# Patient Record
Sex: Male | Born: 1990 | Race: White | Hispanic: No | Marital: Single | Smoking: Never smoker
Health system: Southern US, Community
[De-identification: ages and names within clinical notes are randomized; demographics above are authoritative.]

## PROBLEM LIST (undated history)

## (undated) DIAGNOSIS — S42309A Unspecified fracture of shaft of humerus, unspecified arm, initial encounter for closed fracture: Secondary | ICD-10-CM

## (undated) DIAGNOSIS — L709 Acne, unspecified: Secondary | ICD-10-CM

## (undated) DIAGNOSIS — B019 Varicella without complication: Secondary | ICD-10-CM

## (undated) DIAGNOSIS — F840 Autistic disorder: Secondary | ICD-10-CM

## (undated) DIAGNOSIS — J309 Allergic rhinitis, unspecified: Secondary | ICD-10-CM

## (undated) HISTORY — DX: Unspecified fracture of shaft of humerus, unspecified arm, initial encounter for closed fracture: S42.309A

## (undated) HISTORY — DX: Autistic disorder: F84.0

## (undated) HISTORY — DX: Allergic rhinitis, unspecified: J30.9

## (undated) HISTORY — PX: ADENOIDECTOMY: SUR15

## (undated) HISTORY — DX: Varicella without complication: B01.9

## (undated) HISTORY — PX: FRACTURE SURGERY: SHX138

## (undated) HISTORY — PX: OTHER SURGICAL HISTORY: SHX169

## (undated) HISTORY — DX: Acne, unspecified: L70.9

---
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
## 2009-04-01 ENCOUNTER — Encounter: Admission: RE | Admit: 2009-04-01 | Discharge: 2009-04-01 | Payer: Self-pay | Admitting: Orthopedic Surgery

## 2009-04-16 ENCOUNTER — Ambulatory Visit: Payer: Self-pay | Admitting: Internal Medicine

## 2009-10-24 ENCOUNTER — Ambulatory Visit: Payer: Self-pay | Admitting: Internal Medicine

## 2010-03-25 NOTE — Assessment & Plan Note (Signed)
Summary: cpx/ssc   Vital Signs:  Patient profile:   20 year old male Height:      69.25 inches Weight:      186 pounds Pulse rate:   97 / minute BP sitting:   100 / 60  (right arm) Cuff size:   regular  Vitals Entered By: Romualdo Bolk, CMA (AAMA) (October 24, 2009 8:51 AM) CC: CPX- Transition on medication that Dr. Layla Barter in [place].   History of Present Illness: Brian Mcgrath comes in today  with mom for a check up preventive visit.  Since last visit  here  there have been no major changes in health status  .  Mom asking if possibel we may take over his meds used for autism . No change in meds for a while  Acne using otc wth some help  . No injury change in health status . Allergies :  ok.    Preventive Screening-Counseling & Management  Alcohol-Tobacco     Alcohol drinks/day: 0     Smoking Status: 0     Passive Smoke Exposure: no  Caffeine-Diet-Exercise     Caffeine use/day: 2     Does Patient Exercise: yes  Hep-HIV-STD-Contraception     Sun Exposure-Excessive: no  Safety-Violence-Falls     Seat Belt Use: yes     Smoke Detectors: yes  Current Medications (verified): 1)  Allegra 180 Mg  Tabs (Fexofenadine Hcl) .Marland Kitchen.. 1 By Mouth Once Daily 2)  Valtrex 1 Gm Tabs (Valacyclovir Hcl) .Marland Kitchen.. 1 By Mouth Three Times A Day 3)  Adderall 5 Mg Tabs (Amphetamine-Dextroamphetamine) .Marland Kitchen.. 1 By Mouth Qam and 1/2 in Afternoon. 4)  Tenex 1 Mg Tabs (Guanfacine Hcl) .... 3/4 Tab Two Times A Day 5)  Fluconazole 200 Mg Tabs (Fluconazole) .Marland Kitchen.. 1 By Mouth At Bedtime 6)  Zoloft 50 Mg Tabs (Sertraline Hcl) .... 31/2  By Mouth Once Daily 7)  Tretinoin 0.025 % Crea (Tretinoin) .... Apply Pea Sized Amount To Affected Area At Night  Allergies (verified): No Known Drug Allergies  Past History:  Past medical, surgical, family and social histories (including risk factors) reviewed, and no changes noted (except as noted below).  Past Medical History: Reviewed history from 04/16/2009  and no changes required. autism dx 1-19 years of age TEAAch allergic rhinitis normal preg and delivery with slight meconium Varicella  Acne  Past Surgical History: Reviewed history from 07/18/2008 and no changes required. Rock removed from ear 1999  Wol[place] Recircumised age 17 years Adenoidectomy Tonsillectomy  2003  Past History:  Care Management: Peds: [facility] in [place].  Family History: Reviewed history from 03/24/2007 and no changes required. allergies   dm in mggf father  depression , under eval for elevated PSA OSA  MOM : diverticulosis    Social History: Reviewed history from 07/18/2008 and no changes required. lives at home  Negative history of passive tobacco smoke exposure.  Video games  movies  tai kwon do   No alcohol. Seat Belt Use:  yes Sun Exposure-Excessive:  no  Review of Systems       ros 12 system  neg except for acne and  autism   function well with adls   Physical Exam  General:  Well-developed,well-nourished,in no acute distress; alert,appropriate and cooperative throughout examination Head:  normocephalic, atraumatic, and no abnormalities observed.   Eyes:  vision grossly intact, pupils equal, and pupils round.   Ears:  R ear normal, L ear normal, and no external deformities.   Nose:  no external deformity and no nasal discharge.   Mouth:  pharynx pink and moist.   Neck:  No deformities, masses, or tenderness noted. Chest Wall:  No deformities, masses, tenderness or gynecomastia noted. Lungs:  Normal respiratory effort, chest expands symmetrically. Lungs are clear to auscultation, no crackles or wheezes.no dullness.   Heart:  Normal rate and regular rhythm. S1 and S2 normal without gallop, murmur, click, rub or other extra sounds.no lifts.   Abdomen:  Bowel sounds positive,abdomen soft and non-tender without masses, organomegaly or hernias noted. Genitalia:  Testes bilaterally descended without nodularity, tenderness or masses. No scrotal  masses or lesions. No penis lesions or urethral discharge. Msk:  normal ROM, no joint tenderness, no joint warmth, and no redness over joints.   Pulses:  pulses intact without delay   Extremities:  no clubbing cyanosis or edema  Neurologic:  alert & oriented X3, strength normal in all extremities, and gait normal.  autistic speech cooperative  and interactive  Skin:  midlt o mod acne on face  few inflammatory jaw chin  lesion no scarring  Cervical Nodes:  No lymphadenopathy noted Axillary Nodes:  No palpable lymphadenopathy Inguinal Nodes:  No significant adenopathy Psych:  Oriented X3, not anxious appearing, and not depressed appearing.  autistic but cooperative and monotone speech    Impression & Recommendations:  Problem # 1:  PREVENTIVE HEALTH CARE (ICD-V70.0) counseled healthy    lifestyle eating exercise etc   Problem # 2:  AUTISM (ICD-299.00) no change under care from distant   physician since young   no obv se of meds  mom discussing whether we could rx meds as appropriate since he is stable and  no chang ein meds for quite a while . reviewed med list with her and reasonable to continue zoloft and stimulant meds and tenex .  prob would stop the diflucan and  decrease high dose valtrex that he is onfro high toters of  hsv antibodies . ( no hx of hsv known)     Problem # 3:  ACNE VULGARIS (ICD-706.1) reviewed continued rx    and can still use otcs.   His updated medication list for this problem includes:    Tretinoin 0.025 % Crea (Tretinoin) .Marland Kitchen... Apply pea sized amount to affected area at night  Problem # 4:  ALLERGIC RHINITIS (ICD-477.9)  His updated medication list for this problem includes:    Allegra 180 Mg Tabs (Fexofenadine hcl) .Marland Kitchen... 1 by mouth once daily  Complete Medication List: 1)  Allegra 180 Mg Tabs (Fexofenadine hcl) .Marland Kitchen.. 1 by mouth once daily 2)  Valtrex 1 Gm Tabs (Valacyclovir hcl) .Marland Kitchen.. 1 by mouth three times a day 3)  Adderall 5 Mg Tabs  (Amphetamine-dextroamphetamine) .Marland Kitchen.. 1 by mouth qam and 1/2 in afternoon. 4)  Tenex 1 Mg Tabs (Guanfacine hcl) .... 3/4 tab two times a day 5)  Fluconazole 200 Mg Tabs (Fluconazole) .Marland Kitchen.. 1 by mouth at bedtime 6)  Zoloft 50 Mg Tabs (Sertraline hcl) .... 31/2  by mouth once daily 7)  Tretinoin 0.025 % Crea (Tretinoin) .... Apply pea sized amount to affected area at night  Other Orders: Tdap => 53yrs IM (74259) Admin 1st Vaccine (56387)  Patient Instructions: 1)  yearly check or more frrequent if we monitor his other meds . NO records to review to day .   2)  disc immuniz and do flu shot  this season.  Prescriptions: TRETINOIN 0.025 % CREA (TRETINOIN) apply pea sized amount to affected area at  night  #45 gram x 2   Entered and Authorized by:   Madelin Headings MD   Signed by:   Madelin Headings MD on 10/24/2009   Method used:   Electronically to        [facility] (retail)       [address]       [place], [place]  [address]       Ph: [phone] or [phone]       Fax: [phone]   RxID:   [id number]    Immunizations Administered:  Tetanus Vaccine:    Vaccine Type: Tdap    Site: right deltoid    Mfr: GlaxoSmithKline    Dose: 0.5 ml    Route: IM    Given by: Romualdo Bolk, CMA (AAMA)    Exp. Date: 11/14/2011    Lot #: [id number]  counseled with mom about meds prescribed by autism specialist and reviewed   15 minutes  in addition to preventive visit issues .   WKP.

## 2010-03-25 NOTE — Assessment & Plan Note (Signed)
Summary: COUGH, CONGESTION // RS   Vital Signs:  Patient profile:   20 year old male Weight:      186 pounds BMI:     27.17 Temp:     98.3 degrees F oral BP sitting:   110 / 84  (right arm) Cuff size:   regular  Vitals Entered By: Raechel Ache, RN (April 16, 2009 11:08 AM) CC: Was sick around Feb 5th with fever 102 and cough- better, feels ok but still has cough.   History of Present Illness: Brian Mcgrath comesin today for r above .  Fever lasted about 2 days  weeks ago in the   102 range. Body aches   and since then has gotten better but still has persistent cough episodes  that sound deep.  Brian Mcgrath feels ok and no cp sob st or drainage.   NOt changing activity level. No anorexia currently . Used  cold med when sick .   Non now.  Asks for refill of topical  acne med.   Allergies: No Known Drug Allergies  Past History:  Past Medical History: autism dx 69-31 years of age TEAAch allergic rhinitis normal preg and delivery with slight meconium Varicella  Acne PMH-FH-SH reviewed for relevance  Review of Systems       The patient complains of prolonged cough.  The patient denies anorexia, fever, weight loss, weight gain, vision loss, decreased hearing, hoarseness, chest pain, syncope, headaches, hemoptysis, abdominal pain, melena, hematochezia, depression, unusual weight change, abnormal bleeding, enlarged lymph nodes, and angioedema.    Physical Exam  General:  alert, well-developed, well-nourished, and well-hydrated.   Head:  normocephalic and atraumatic.   Eyes:  vision grossly intact and pupils equal.   Ears:  R ear normal and L ear normal.   Nose:  no external deformity, no external erythema, and no nasal discharge.   Mouth:  pharynx pink and moist.   Neck:  No deformities, masses, or tenderness noted. Lungs:  Normal respiratory effort, chest expands symmetrically. Lungs are clear to auscultation, no crackles or wheezes.no dullness.   Heart:  Normal rate and regular  rhythm. S1 and S2 normal without gallop, murmur, click, rub or other extra sounds. Abdomen:  Bowel sounds positive,abdomen soft and non-tender without masses, organomegaly or   noted. Pulses:  nl cap refill  Skin:  turgor normal, color normal, no ecchymoses, and no petechiae.  few acne lesions on face  Cervical Nodes:  No lymphadenopathy noted   Impression & Recommendations:  Problem # 1:  COUGH (ICD-786.2) seems post infectious and  can rx symptom if at all and follow up if persistent and progressive  consider c xray and or steroids  course .    Problem # 2:  ACNE VULGARIS (ICD-706.1) refill med  His updated medication list for this problem includes:    Tretinoin 0.025 % Crea (Tretinoin) .Marland Kitchen... Apply pea sized amount to affected area at night  Problem # 3:  AUTISM (ICD-299.00) Assessment: Comment Only  Complete Medication List: 1)  Allegra 180 Mg Tabs (Fexofenadine hcl) .Marland Kitchen.. 1 by mouth once daily 2)  Valtrex 1 Gm Tabs (Valacyclovir hcl) .Marland Kitchen.. 1 by mouth three times a day 3)  Adderall 5 Mg Tabs (Amphetamine-dextroamphetamine) .Marland Kitchen.. 1 by mouth qam and 1/2 in afternoon. 4)  Tenex 1 Mg Tabs (Guanfacine hcl) .... 3/4 tab two times a day 5)  Fluconazole 200 Mg Tabs (Fluconazole) .Marland Kitchen.. 1 by mouth at bedtime 6)  Zoloft 50 Mg Tabs (Sertraline hcl) .Marland KitchenMarland KitchenMarland Kitchen  31/2  by mouth once daily 7)  Imunovir 500mg /inosine Pranobex  .Marland KitchenMarland Kitchen. 1 by mouth qid for 5 days and 1 by mouth two times a day on the weekends 8)  Tretinoin 0.025 % Crea (Tretinoin) .... Apply pea sized amount to affected area at night  Patient Instructions: 1)  I think this is post infectious cough .   2)  call if persistent and progressive  fever or shortness of breath.    Prescriptions: TRETINOIN 0.025 % CREA (TRETINOIN) apply pea sized amount to affected area at night  #45 gram x 2   Entered and Authorized by:   Madelin Headings MD   Signed by:   Madelin Headings MD on 04/16/2009   Method used:   Electronically to        [facility] (retail)       [address]       [place], [place]  [address]       Ph: [phone] or [phone]       Fax: [phone]   RxID:   [id number]

## 2010-07-08 NOTE — Op Note (Signed)
NAME:  Brian Mcgrath, Brian Mcgrath NO.:  [id number]   MEDICAL RECORD NO.:  [id number]          PATIENT TYPE:  INP   LOCATION:  6120                         FACILITY:  [facility]   PHYSICIAN:  Doralee Albino. Carola Frost, M.D. DATE OF BIRTH:  [birth year]   DATE OF PROCEDURE:  11/06/2007  DATE OF DISCHARGE:                               OPERATIVE REPORT   PREOPERATIVE DIAGNOSIS:  Grade 1 open right radius and ulnar fractures.   POSTOPERATIVE DIAGNOSIS:  Closed radius and ulnar fractures.   PROCEDURE:  Open reduction and internal fixation of radius and ulna,  right.   SURGEON:  Doralee Albino. Carola Frost, MD   ASSISTANT:  Mearl Latin, PA   ANESTHESIA:  General.   COMPLICATIONS:  None.   TOURNIQUET:  None.   ESTIMATED BLOOD LOSS:  Less than 50 mL.   DRAINS:  None.   DISPOSITION:  To PACU.   CONDITION:  Stable.   BRIEF SUMMARY AND INDICATIONS FOR PROCEDURE:  Brian Mcgrath is a very  pleasant 20 year old right-hand dominant male who sustained a displaced  both bone forearm fracture with a wound along the ulnar aspect of his  forearm in the area of deformity.  He received Ancef on evaluation in  the ED and he then underwent surgery.  I discussed with the patient who  is autistic as well as his mother, the risks and benefits of surgery  including the possibility of failure to prevent infection, need for  further operations, malunion, nonunion, decreased range of motion,  failure of his ulnar nerve to recover as he did have a preoperative  deficit and multiple others.  After full discussion, mom wished to  proceed.   BRIEF DESCRIPTION OF PROCEDURE:  Brian Mcgrath again was on Ancef and was  taken to the OR where general anesthesia was induced.  His right upper  extremity was prepped and draped in usual sterile fashion.  We did place  a tourniquet about the arm and but never inflated it during the  procedure.  Standard prep and drape was performed.  I then marked both  the radial and ulnar  incisions and began with the ulna to determine  whether or not it was in fact open.  The wound was approximately 2 cm  and seemed to be superficial after scrubbing aggressively with a  chlorhexidine scrub brush.  There was no communication or rent  with the  fascia that extended down to the fracture site.  Consequently, it was  ruled a closed-arm fracture.  Along the subcutaneous border, I continued  the dissection to identify the fracture site and then performed a  reduction while leaving the periosteum intact.  I selected an 8-hole  plate for this side, placing screws in the penultimate holes and then I  turned attention to the radius where I made a standard volar Sherilyn Cooter  approach extending down to the deep fascia identifying the radial artery  and retracting it ulnarly and proceeding until visualized the pronator,  which was followed down to the fracture site.  The fracture site was  cleaned with lavage and curette and  then reduced anatomically.  There  was considerable comminution on the dorsal side.  Periosteum remained  attached posteriorly.  I then fashioned a six-hole LC-DCP plate and  placed two penultimate holes and this plate as well.  AP and lateral  images at that time showed appropriate reduction and placement.  Consequently, continued with screws fixation obtaining compression  across these fracture sites and then placing at least six cortices of  standard fixation on either side of the fractures.  Wounds were  copiously irrigated and then closed in standard layered fashion with 2-0  Vicryl, 3-0 Prolene after confirming with AP and lateral fluoro images  appropriate reduction, implant position, and length.  Sterile gently  compressive dressing was applied as well as a volar splint.  The patient  was awakened from anesthesia and transferred to the PACU in stable  condition.   PROGNOSIS:  Brian Mcgrath should do well following this repair and will have  unrestricted range of motion  of the elbow, wrist, and hand as soon as  his pain is adequately controlled to do so.  I would expect that his  ulnar deficit is related to a stretcher bruising and should resolve  without the need for further intervention.  Given his autism, I would  anticipate some level of increase in his risk for noncompliance with  weightbearing on that side, but that is by no means given and we will  follow up with his mother further to make sure appropriate precautions  are taken if needed.      Doralee Albino. Carola Frost, M.D.  Electronically Signed     MHH/MEDQ  D:  11/06/2007  T:  11/07/2007  Job:  [id number]

## 2010-07-11 NOTE — Op Note (Signed)
NAME:  Brian Mcgrath, Brian Mcgrath                          ACCOUNT NO.:  [id number]   MEDICAL RECORD NO.:  [id number]                   PATIENT TYPE:  AMB   LOCATION:  DSC                                  FACILITY:  [facility]   PHYSICIAN:  Kristine Garbe. Ezzard Standing, M.D.         DATE OF BIRTH:  06-16-1990   DATE OF PROCEDURE:  01/24/2002  DATE OF DISCHARGE:                                 OPERATIVE REPORT   PREOPERATIVE DIAGNOSIS:  Recurrent tonsillitis.   POSTOPERATIVE DIAGNOSIS:  Recurrent tonsillitis.   OPERATION:  Tonsillectomy and adenoidectomy.   SURGEON:  Kristine Garbe. Ezzard Standing, M.D.   ANESTHESIA:  General endotracheal.   COMPLICATIONS:  None.   BRIEF CLINICAL NOTE:  The patient is a 20 year old autistic child who has  had chronic inflamed lymphadenopathy in his neck as well as several sore  throats with episodes of Strep.  On examination, he has moderate-sized 2-3+  size tonsils.  He is taken to the operating room at this time for a  tonsillectomy and adenoidectomy.   DESCRIPTION OF PROCEDURE:  After adequate endotracheal anesthesia, the  patient received 8 mg of Decadron IV preoperatively as well as 1 g Ancef IV  preoperatively.  Mouthgag was used to expose the oropharynx.  The left  tonsil was resected first from the tonsillar fossa.  It was imbedded in the  tonsillar fossa, was resected with the cautery, taking care to preserve the  anterior and posterior tonsillar pillars as well as the uvula.  Following  this, the right tonsil was resected from the tonsillar fossa.  This  completed the tonsillectomy.  Hemostasis was obtained with the cautery.  A  red rubber catheter was passed through the nose and out the mouth to retract  the soft palate, and the nasopharynx was examined.  The patient just had a  minimal amount of adenoid tissue.  Suction cautery was used to cauterize the  central pad of adenoid tissue.  This completed the procedure.  The nose and  nasopharynx were irrigated with  saline.  The patient was awoken from  anesthesia and transferred to the recovery room postoperatively doing well.    DISPOSITION:  The patient will be observed overnight at the [facility] and discharged home in the morning on amoxicillin suspension  400 mg b.i.d. for one week, Tylenol and Lortab elixir 2-3 teaspoons q.4h.  p.r.n. pain.  I will have him follow up in my office in two weeks for  recheck.                                               Kristine Garbe. Ezzard Standing, M.D.    CEN/MEDQ  D:  01/24/2002  T:  01/24/2002  Job:  [id number]   cc:   Neta Mends. Fabian Sharp, M.D. [facility]  [address]  [place]  [place] [address]  Fax: [phone]

## 2010-11-26 LAB — CBC
Hemoglobin: 14.7
Hemoglobin: 15.1
MCHC: 33.5
MCV: 91.6
MCV: 92.4
Platelets: 245
Platelets: 253
RBC: 4.75
RDW: 12.8
WBC: 10.8 — ABNORMAL HIGH

## 2010-11-26 LAB — BASIC METABOLIC PANEL
CO2: 27
Calcium: 9.3
Chloride: 99
Sodium: 134 — ABNORMAL LOW

## 2010-11-26 LAB — DIFFERENTIAL
Basophils Absolute: 0
Basophils Relative: 0
Eosinophils Absolute: 0
Neutrophils Relative %: 77

## 2011-10-21 IMAGING — CR DG FOREARM 2V*R*
2 series · 2 of 2 positions shown · non-contrast
Comparison: 11/06/2007

CLINICAL DATA: Post ORIF for radial and ulnar fractures

RIGHT FOREARM - 2 VIEW

[view not recorded (1 of 2)]
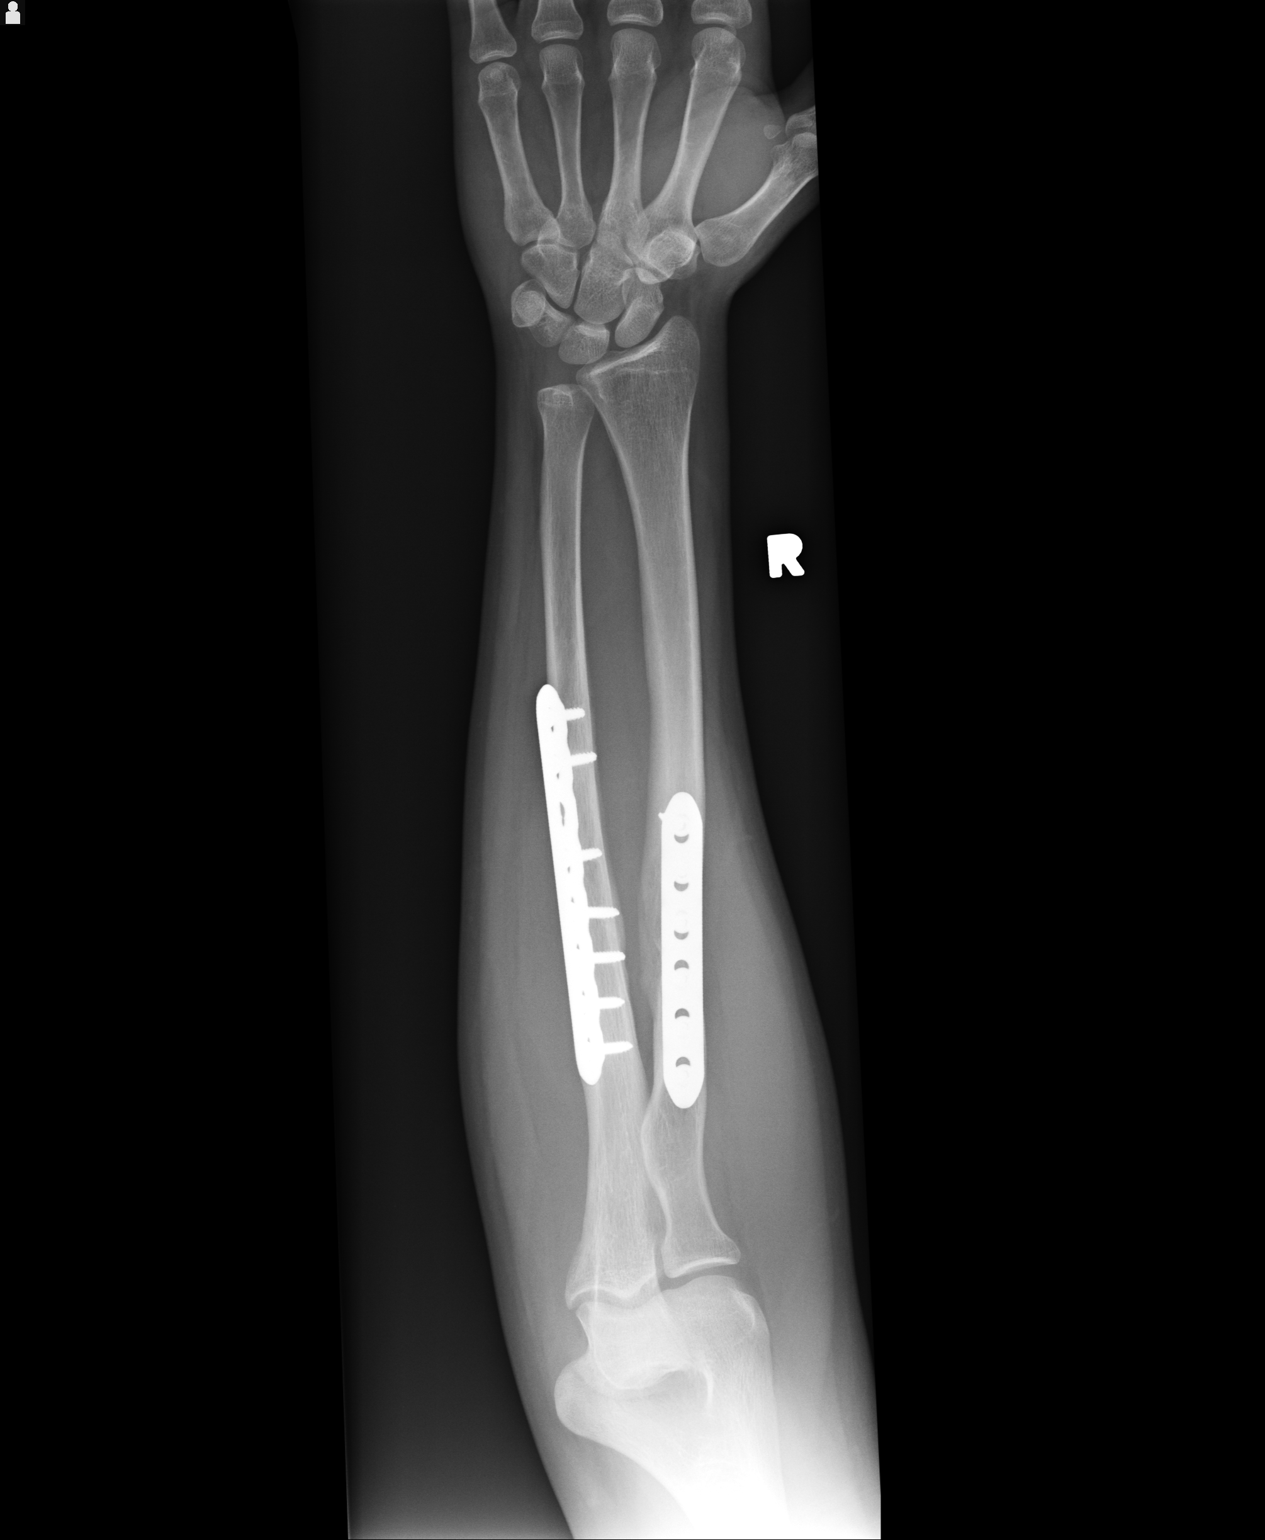

[view not recorded (2 of 2)]
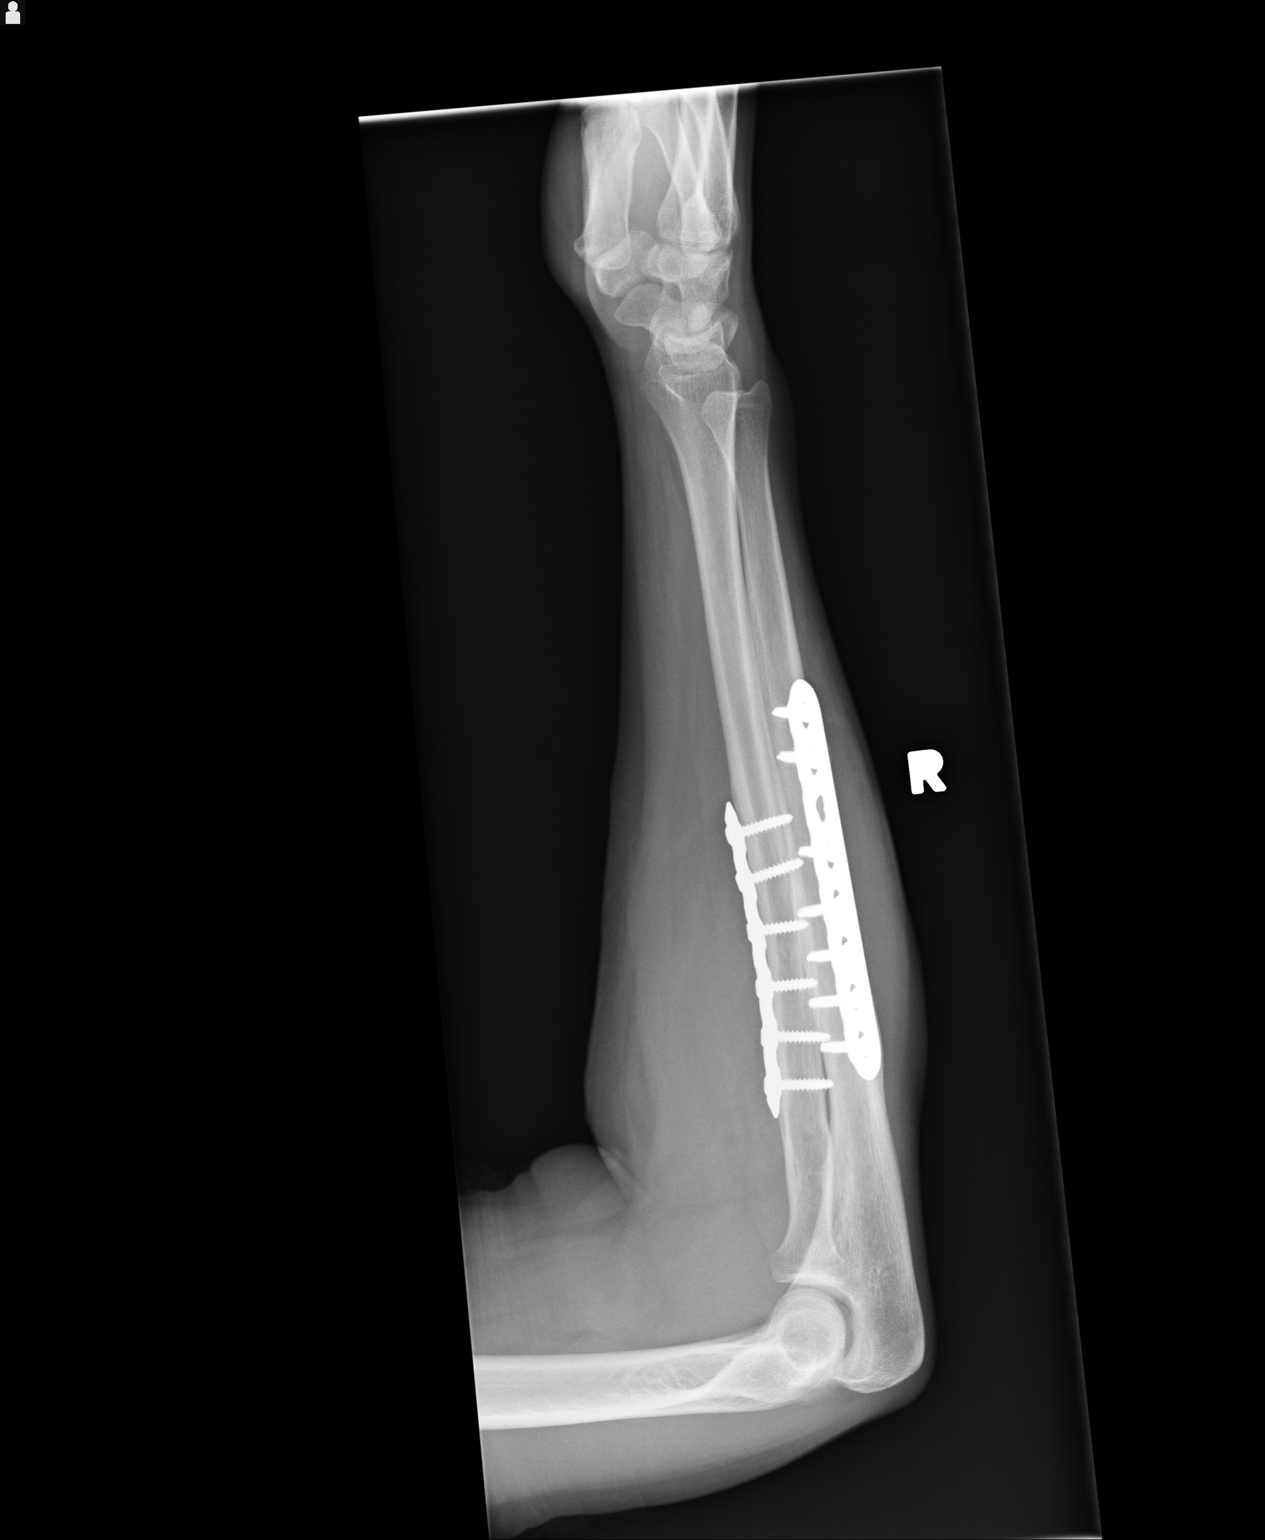

[2 of 2 positions shown; findings below may reference images not displayed]

FINDINGS: Status post screw plate fixation of radial and ulnar
fractures.  No evidence for fracture, dislocation, or
osteomyelitis. The hardware and screws appear to be intact without
obvious loosening or lucency around the screws.  No gas or foreign
body in the soft tissues.
IMPRESSION: Postsurgical changes with no obvious complications or acute
findings.

## 2012-02-29 ENCOUNTER — Ambulatory Visit (INDEPENDENT_AMBULATORY_CARE_PROVIDER_SITE_OTHER): Payer: BC Managed Care – PPO | Admitting: Internal Medicine

## 2012-02-29 ENCOUNTER — Encounter: Payer: Self-pay | Admitting: Internal Medicine

## 2012-02-29 VITALS — BP 112/80 | HR 96 | Temp 98.0°F | Wt 180.0 lb

## 2012-02-29 DIAGNOSIS — L0889 Other specified local infections of the skin and subcutaneous tissue: Secondary | ICD-10-CM

## 2012-02-29 DIAGNOSIS — L858 Other specified epidermal thickening: Secondary | ICD-10-CM

## 2012-02-29 DIAGNOSIS — K130 Diseases of lips: Secondary | ICD-10-CM

## 2012-02-29 DIAGNOSIS — F840 Autistic disorder: Secondary | ICD-10-CM

## 2012-02-29 DIAGNOSIS — Q828 Other specified congenital malformations of skin: Secondary | ICD-10-CM

## 2012-02-29 NOTE — Progress Notes (Signed)
Chief Complaint  Patient presents with  . chapped lips at christmas    HPI: Patient comes in today for SDA for  new problem evaluation. Here with mom today Last visit was 9 2011 .  At x mas very red lips  And hard to eat around x mas  And after a while  improved and then came back.  Last week.  Has had some cracking at sies at times  . Using berts bees  Feels better with this . No lip licking or changes in oral health. 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  Reviewed meds on bid valtrex for autsim rx with lexapro  ROS: See pertinent positives and negatives per HPI.  Past Medical History  Diagnosis Date  . Autism   . Allergic rhinitis   . Varicella   . Acne   . FRACTURE, ARM, RIGHT 07/18/2008    Qualifier: History of  By: Fabian Sharp MD, Neta Mends     Family History  Problem Relation Age of Onset  . Diverticulosis Mother   . Depression Father     History   Social History  . Marital Status: Single    Spouse Name: N/A    Number of Children: N/A  . Years of Education: N/A   Social History Main Topics  . Smoking status: Never Smoker   . Smokeless tobacco: None  . Alcohol Use: No  . Drug Use: None  . Sexually Active: None   Other Topics Concern  . 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 escitalopram (LEXAPRO) 10 MG tablet Take 10 mg by mouth daily. Alternating dosing      . guanFACINE (TENEX) 1 MG tablet Take 1 mg by mouth. Takes 3/4 tab bid      . valACYclovir (VALTREX) 1000 MG tablet Take 1,000 mg by mouth 2 (two) times daily.        EXAM:  BP 112/80  Pulse 96  Temp 98 F (36.7 C) (Oral)  Wt 180 lb (81.647 kg)  SpO2 98%  There is no height on file to calculate BMI.  GENERAL: vitals reviewed and listed above, alert, oriented, appears well hydrated and in  no acute distress  HEENT: atraumatic, conjunctiva  clear, no obvious abnormalities on inspection of external nose and ears OP : no lesion edema or exudate   Lips mild redness and chapping and minimal perleche no weeping or ulcers  Chewing gum tongue appears clear   NECK: no obvious masses on inspection palpation  No adenopathy  Skin upper arms keratosis pilaris  No other sig rash MS: moves all extremities without noticeable focal  abnormality  PSYCH:  cooperative,  Appears well   Monotone speech   ASSESSMENT AND PLAN:  Discussed the following assessment and plan:  1. Chapped lips   2. Perleche    mild   3. Keratosis pilaris    expectant management and moiusturizers   4. Autism spectrum disorder     -Patient advised to return or notify health care team  immediately if symptoms worsen or persist or new concerns arise.  Patient Instructions  Suspect this is a combination of drying and winter eczema  Dermatitis . Use topical moisturizers frequently without menthol or  flavors that you could be sensitive to. Also can  use small amount of polysporin  Or monistat at corners of mouth twice a day if cracking and weeping but not  On the lips  Otherwise.     Neta Mends. Yariah Selvey M.D.

## 2012-02-29 NOTE — Patient Instructions (Addendum)
Suspect this is a combination of drying and winter eczema  Dermatitis . Use topical moisturizers frequently without menthol or  flavors that you could be sensitive to. Also can use small amount of polysporin  Or monistat at corners of mouth twice a day if cracking and weeping but not  On the lips  Otherwise.

## 2013-02-14 ENCOUNTER — Other Ambulatory Visit: Payer: Self-pay | Admitting: Internal Medicine

## 2013-02-14 NOTE — Telephone Encounter (Signed)
Pt is out of meds

## 2013-02-14 NOTE — Telephone Encounter (Signed)
I thought i had but dont see any  in ehr. Contact mom and ask about this  Willing to rx but he needs  Wellness or medication evaluation visist in January.

## 2013-02-14 NOTE — Telephone Encounter (Signed)
Do not see where you have prescribed this.  Please advise.  Thanks!

## 2013-02-17 NOTE — Telephone Encounter (Signed)
Called and spoke with pt's mother and she states she spoke with [facility] when the medication was called in.  Pt's mother is going to bring pt in Jan 2015 to see Dr. Fabian Sharp and discuss these two medications.  Per mother pt is taking Lexapro 20 mg- take 2 1/4 tablet daily and Tenex 1 mg- take 3/4 bid.

## 2013-03-09 ENCOUNTER — Telehealth: Payer: Self-pay | Admitting: Family Medicine

## 2013-03-09 NOTE — Telephone Encounter (Signed)
Error

## 2013-03-23 ENCOUNTER — Other Ambulatory Visit: Payer: Self-pay | Admitting: Internal Medicine

## 2013-03-24 MED ORDER — GUANFACINE HCL 1 MG PO TABS: ORAL_TABLET | ORAL | Status: DC

## 2013-03-24 NOTE — Telephone Encounter (Signed)
Pt also need refill on generic tenex. Pt has appt sch for 04-05-13. Pt is out of meds

## 2013-03-24 NOTE — Telephone Encounter (Signed)
meds were sent in  for one month only

## 2013-04-05 ENCOUNTER — Encounter: Payer: Self-pay | Admitting: Internal Medicine

## 2013-04-05 ENCOUNTER — Ambulatory Visit (INDEPENDENT_AMBULATORY_CARE_PROVIDER_SITE_OTHER): Payer: BC Managed Care – PPO | Admitting: Internal Medicine

## 2013-04-05 VITALS — BP 100/50 | HR 83 | Temp 98.1°F | Ht 69.25 in | Wt 200.0 lb

## 2013-04-05 DIAGNOSIS — F840 Autistic disorder: Secondary | ICD-10-CM

## 2013-04-05 DIAGNOSIS — L709 Acne, unspecified: Secondary | ICD-10-CM

## 2013-04-05 DIAGNOSIS — Z79899 Other long term (current) drug therapy: Secondary | ICD-10-CM

## 2013-04-05 DIAGNOSIS — L708 Other acne: Secondary | ICD-10-CM

## 2013-04-05 MED ORDER — ESCITALOPRAM OXALATE 20 MG PO TABS: ORAL_TABLET | ORAL | Status: DC

## 2013-04-05 MED ORDER — ADAPALENE 0.1 % EX CREA: TOPICAL_CREAM | Freq: Every day | CUTANEOUS | Status: DC

## 2013-04-05 MED ORDER — GUANFACINE HCL 1 MG PO TABS: ORAL_TABLET | ORAL | Status: DC

## 2013-04-05 MED ORDER — VALACYCLOVIR HCL 1 G PO TABS: 1000.0000 mg | ORAL_TABLET | Freq: Every day | ORAL | Status: DC

## 2013-04-05 NOTE — Progress Notes (Signed)
Pre visit review using our clinic review tool, if applicable. No additional management support is needed unless otherwise documented below in the visit note.   Chief Complaint  Patient presents with  . Follow-up    Meds acne    HPI: FU meds  Last visit over a year ago.  Here with mom. Regarding taking over medication management  For meds he has been on for a while for asd.t alternative medication in [place]  Prev md  Layla BarterGoldberg  Has had licence suspended.  But had been doing well so cant rx the meds   Had protochol.    ? 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  Eats fast food a bit  On no acne med for a while out  Was on oral and differen?  Has few areas on face . No se of prev meds  ROS: See pertinent positives and negatives per HPI. Arc barks.   Past Medical History  Diagnosis Date  . Autism   . Allergic rhinitis   . Varicella   . Acne   . FRACTURE, ARM, RIGHT 07/18/2008    Qualifier: History of  By: Fabian SharpPanosh MD, Neta MendsWanda K     Family History  Problem Relation Age of Onset  . Diverticulosis Mother   . Depression Father     History   Social History  . Marital Status: Single    Spouse Name: N/A    Number of Children: N/A  . Years of Education: N/A   Social History Main Topics  . Smoking status: Never Smoker   . Smokeless tobacco: None  . Alcohol Use: No  . Drug Use: None  . Sexual Activity: None   Other Topics Concern  . 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 escitalopram (LEXAPRO) 20 MG tablet TAKE 2  TABLETS BY MOUTH EVERY MORNING  . guanFACINE (TENEX) 1 MG tablet TAKE 3/4 TABLET BY MOUTH TWICE DAILY  . [DISCONTINUED] escitalopram  (LEXAPRO) 20 MG tablet TAKE 2 1/4 TABLETS BY MOUTH EVERY MORNING  . [DISCONTINUED] guanFACINE (TENEX) 1 MG tablet TAKE 3/4 TABLET BY MOUTH TWICE DAILY  . adapalene (DIFFERIN) 0.1 % cream Apply topically at bedtime. Pea sized amount to affected area  As directed  . valACYclovir (VALTREX) 1000 MG tablet Take 1 tablet (1,000 mg total) by mouth daily.  . [DISCONTINUED] escitalopram (LEXAPRO) 10 MG tablet Take 2 1/4 tab every morning.  . [DISCONTINUED] valACYclovir (VALTREX) 1000 MG tablet Take 1,000 mg by mouth daily.     EXAM:  BP 100/50  Pulse 83  Temp(Src) 98.1 F (36.7 C) (Oral)  Ht 5' 9.25" (1.759 m)  Wt 200 lb (90.719 kg)  BMI 29.32 kg/m2  SpO2 98%  Body mass index is 29.32 kg/(m^2).  GENERAL: vitals reviewed and listed above, alert, oriented, appears well hydrated and in no acute distress polite and interactive  HEENT: atraumatic, conjunctiva  clear, no obvious abnormalities on inspection of external nose and ears OP : no lesion edema or exudate  NECK: no obvious masses on inspection palpation  LUNGS: clear to auscultation bilaterally, no wheezes, rales or rhonchi,  good air movement CV: HRRR, no clubbing cyanosis or  peripheral edema nl cap refill  MS: moves all extremities without noticeable focal  abnormality Skin less than 10 acne like lesion on face chin and cheek area no scarring.  PSYCH: pleasant and cooperative, no obvious depression or anxiety monotone voice   ASSESSMENT AND PLAN:  Discussed the following assessment and plan:  Autism spectrum disorder  Medication management  Acne conrtinue med at dose discussed  Doing well  Dis eating lsi  Add adapalene back for acne  Problem List Items Addressed This Visit   Autism spectrum disorder - Primary     Ok to take over meds at this time  Dec lexapro to 40 per day  Valtrex 1000mg  per day . Refills and rov with labs in 6 months  Seems to be thriving and active a number of activities     Acne   Relevant Medications       adapalene (DIFFERIN) cream 0.1%    Other Visit Diagnoses   Medication management           -Patient advised to return or notify health care team  if symptoms worsen or persist or new concerns arise.  Patient Instructions  CONTINUE MEDICATIONS AS DIRECTED AT PRESENT Healthy life style 150 minutes of exercise weeks  , weight  To healthy levels. Avoid trans fats and processed foods;  Increase fresh fruits and veges to 5 servings per day. And avoid sweet beverages  Including tea and juice.  return office visit in 6-12 months  Preventive visit     Neta Mends. Panosh M.D.

## 2013-04-05 NOTE — Assessment & Plan Note (Signed)
Ok to take over meds at this time  Dec lexapro to 40 per day  Valtrex 1000mg  per day . Refills and rov with labs in 6 months  Seems to be thriving and active a number of activities

## 2013-04-05 NOTE — Patient Instructions (Signed)
CONTINUE MEDICATIONS AS DIRECTED AT PRESENT Healthy life style 150 minutes of exercise weeks  , weight  To healthy levels. Avoid trans fats and processed foods;  Increase fresh fruits and veges to 5 servings per day. And avoid sweet beverages  Including tea and juice.  return office visit in 6-12 months  Preventive visit

## 2013-05-01 ENCOUNTER — Telehealth: Payer: Self-pay | Admitting: Family Medicine

## 2013-05-01 NOTE — Telephone Encounter (Signed)
The patient's mother left a message on my machine.  I returned her call.  She is asking about a letter from Dr. Fabian SharpPanosh and she wanted to know if the we had received the paperwork. Advised that we have received the paperwork and it is on WP's desk.  Mother [facility] of [facility] office until 05/02/13.

## 2013-05-02 NOTE — Telephone Encounter (Signed)
Th e mail about writing a letter doesn't include what the request is . Looks like the email is cut off at the right margin .  Laying on  Your desk please find out what they are asking for.  Thanks

## 2013-05-03 NOTE — Telephone Encounter (Signed)
Tried calling Brian Mcgrath or Brian Mcgrath.  Received an automated message that no one was available and then the call was disconnected.  Will try again at a later time.

## 2013-05-03 NOTE — Telephone Encounter (Signed)
Tried calling at the home number.  Received the same message and the phone was disconnected.  Will try again at a later time.

## 2013-05-04 NOTE — Telephone Encounter (Signed)
Spoke to the pt's mom.  She will resend documentation that is needed.

## 2013-05-09 NOTE — Telephone Encounter (Signed)
Received new fax and placed it on WP's desk.

## 2013-05-11 ENCOUNTER — Encounter: Payer: Self-pay | Admitting: Internal Medicine

## 2013-05-11 NOTE — Telephone Encounter (Signed)
Letter is done.

## 2013-05-11 NOTE — Telephone Encounter (Signed)
Spoke to Brian Mcgrath and informed her that the letter is not complete.  Will check with WP to see if any further information is needed.  New fax received and put on WP's desk.

## 2013-05-11 NOTE — Telephone Encounter (Signed)
teddi cell : [phone] Mom would like to pu letter today. She will be on this side of town later on. Call home number or cell if she doesn't answer.

## 2013-05-11 NOTE — Telephone Encounter (Signed)
Patient's mother notified to pick up at the front desk. 

## 2013-05-11 NOTE — Telephone Encounter (Signed)
Left message for Brian Mcgrath to return my call.

## 2013-11-01 ENCOUNTER — Other Ambulatory Visit: Payer: Self-pay | Admitting: Internal Medicine

## 2013-11-01 NOTE — Telephone Encounter (Signed)
Declined.  Filled for 1 year in Feb. 2015.

## 2014-05-04 ENCOUNTER — Other Ambulatory Visit: Payer: Self-pay | Admitting: Internal Medicine

## 2014-05-04 NOTE — Telephone Encounter (Signed)
Sent to the pharmacy by e-scribe.  Spoke to the patient's mother.  Scheduled a follow up visit on 05/23/14

## 2014-05-23 ENCOUNTER — Encounter: Payer: Self-pay | Admitting: Internal Medicine

## 2014-05-23 ENCOUNTER — Ambulatory Visit (INDEPENDENT_AMBULATORY_CARE_PROVIDER_SITE_OTHER): Payer: BLUE CROSS/BLUE SHIELD | Admitting: Internal Medicine

## 2014-05-23 VITALS — BP 100/60 | Temp 98.5°F | Ht 69.0 in | Wt 202.9 lb

## 2014-05-23 DIAGNOSIS — Z418 Encounter for other procedures for purposes other than remedying health state: Secondary | ICD-10-CM | POA: Diagnosis not present

## 2014-05-23 DIAGNOSIS — Z79899 Other long term (current) drug therapy: Secondary | ICD-10-CM | POA: Diagnosis not present

## 2014-05-23 DIAGNOSIS — F840 Autistic disorder: Secondary | ICD-10-CM | POA: Diagnosis not present

## 2014-05-23 DIAGNOSIS — L700 Acne vulgaris: Secondary | ICD-10-CM | POA: Diagnosis not present

## 2014-05-23 DIAGNOSIS — Z299 Encounter for prophylactic measures, unspecified: Secondary | ICD-10-CM

## 2014-05-23 LAB — HEPATIC FUNCTION PANEL
ALBUMIN: 4.5 g/dL (ref 3.5–5.2)
ALT: 20 U/L (ref 0–53)
AST: 21 U/L (ref 0–37)
Alkaline Phosphatase: 64 U/L (ref 39–117)
BILIRUBIN DIRECT: 0.1 mg/dL (ref 0.0–0.3)
TOTAL PROTEIN: 7.1 g/dL (ref 6.0–8.3)
Total Bilirubin: 0.5 mg/dL (ref 0.2–1.2)

## 2014-05-23 LAB — BASIC METABOLIC PANEL
BUN: 13 mg/dL (ref 6–23)
CO2: 34 mEq/L — ABNORMAL HIGH (ref 19–32)
Calcium: 9.6 mg/dL (ref 8.4–10.5)
Chloride: 101 mEq/L (ref 96–112)
Creatinine, Ser: 0.81 mg/dL (ref 0.40–1.50)
GFR: 123.64 mL/min (ref >60.00)
GLUCOSE: 59 mg/dL — AB (ref 70–99)
POTASSIUM: 4 meq/L (ref 3.5–5.1)
Sodium: 138 mEq/L (ref 135–145)

## 2014-05-23 LAB — CBC WITH DIFFERENTIAL/PLATELET
BASOS ABS: 0 10*3/uL (ref 0.0–0.1)
Basophils Relative: 0.3 % (ref 0.0–3.0)
EOS ABS: 0 10*3/uL (ref 0.0–0.7)
Eosinophils Relative: 1 % (ref 0.0–5.0)
HCT: 44.8 % (ref 39.0–52.0)
HEMOGLOBIN: 15.4 g/dL (ref 13.0–17.0)
LYMPHS ABS: 1.7 10*3/uL (ref 0.7–4.0)
Lymphocytes Relative: 37.8 % (ref 12.0–46.0)
MCHC: 34.5 g/dL (ref 30.0–36.0)
MCV: 89.9 fl (ref 78.0–100.0)
MONO ABS: 0.5 10*3/uL (ref 0.1–1.0)
Monocytes Relative: 11.4 % (ref 3.0–12.0)
NEUTROS PCT: 49.5 % (ref 43.0–77.0)
Neutro Abs: 2.2 10*3/uL (ref 1.4–7.7)
Platelets: 252 10*3/uL (ref 150.0–400.0)
RBC: 4.99 Mil/uL (ref 4.22–5.81)
RDW: 12.9 % (ref 11.5–15.5)
WBC: 4.5 10*3/uL (ref 4.0–10.5)

## 2014-05-23 LAB — LIPID PANEL
CHOLESTEROL: 164 mg/dL (ref 0–200)
HDL: 35.8 mg/dL — ABNORMAL LOW (ref >39.00)
NONHDL: 128.2
Total CHOL/HDL Ratio: 5
Triglycerides: 279 mg/dL — ABNORMAL HIGH (ref 0.0–149.0)
VLDL: 55.8 mg/dL — ABNORMAL HIGH (ref 0.0–40.0)

## 2014-05-23 LAB — LDL CHOLESTEROL, DIRECT: LDL DIRECT: 102 mg/dL

## 2014-05-23 LAB — TSH: TSH: 1.43 u[IU]/mL (ref 0.35–4.50)

## 2014-05-23 NOTE — Patient Instructions (Signed)
Continue lifestyle intervention healthy eating and exercise . Increase exercise . As tolerated   Ok to refill meds when due  Wellness visit in a year.      Why follow it? Research shows. . Those who follow the Mediterranean diet have a reduced risk of heart disease  . The diet is associated with a reduced incidence of Parkinson's and Alzheimer's diseases . People following the diet may have longer life expectancies and lower rates of chronic diseases  . The Dietary Guidelines for Americans recommends the Mediterranean diet as an eating plan to promote health and prevent disease  What Is the Mediterranean Diet?  . Healthy eating plan based on typical foods and recipes of Mediterranean-style cooking . The diet is primarily a plant based diet; these foods should make up a majority of meals   Starches - Plant based foods should make up a majority of meals - They are an important sources of vitamins, minerals, energy, antioxidants, and fiber - Choose whole grains, foods high in fiber and minimally processed items  - Typical grain sources include wheat, oats, barley, corn, brown rice, bulgar, farro, millet, polenta, couscous  - Various types of beans include chickpeas, lentils, fava beans, black beans, white beans   Fruits  Veggies - Large quantities of antioxidant rich fruits & veggies; 6 or more servings  - Vegetables can be eaten raw or lightly drizzled with oil and cooked  - Vegetables common to the traditional Mediterranean Diet include: artichokes, arugula, beets, broccoli, brussel sprouts, cabbage, carrots, celery, collard greens, cucumbers, eggplant, kale, leeks, lemons, lettuce, mushrooms, okra, onions, peas, peppers, potatoes, pumpkin, radishes, rutabaga, shallots, spinach, sweet potatoes, turnips, zucchini - Fruits common to the Mediterranean Diet include: apples, apricots, avocados, cherries, clementines, dates, figs, grapefruits, grapes, melons, nectarines, oranges, peaches, pears,  pomegranates, strawberries, tangerines  Fats - Replace butter and margarine with healthy oils, such as olive oil, canola oil, and tahini  - Limit nuts to no more than a handful a day  - Nuts include walnuts, almonds, pecans, pistachios, pine nuts  - Limit or avoid candied, honey roasted or heavily salted nuts - Olives are central to the [employer]iterranean diet - can be eaten whole or used in a variety of dishes   Meats Protein - Limiting red meat: no more than a few times a month - When eating red meat: choose lean cuts and keep the portion to the size of deck of cards - Eggs: approx. 0 to 4 times a week  - Fish and lean poultry: at least 2 a week  - Healthy protein sources include, chicken, [place], lean beef, lamb - Increase intake of seafood such as tuna, salmon, trout, mackerel, shrimp, scallops - Avoid or limit high fat processed meats such as sausage and bacon  Dairy - Include moderate amounts of low fat dairy products  - Focus on healthy dairy such as fat free yogurt, skim milk, low or reduced fat cheese - Limit dairy products higher in fat such as whole or 2% milk, cheese, ice cream  Alcohol - Moderate amounts of red wine is ok  - No more than 5 oz daily for women (all ages) and men older than age 25  - No more than 10 oz of wine daily for men younger than 8165  Other - Limit sweets and other desserts  - Use herbs and spices instead of salt to flavor foods  - Herbs and spices common to the traditional Mediterranean Diet include: basil, bay leaves, chives,  cloves, cumin, fennel, garlic, lavender, marjoram, mint, oregano, parsley, pepper, rosemary, sage, savory, sumac, tarragon, thyme   It's not just a diet, it's a lifestyle:  . The Mediterranean diet includes lifestyle factors typical of those in the region  . Foods, drinks and meals are best eaten with others and savored . Daily physical activity is important for overall good health . This could be strenuous exercise like running and  aerobics . This could also be more leisurely activities such as walking, housework, yard-work, or taking the stairs . Moderation is the key; a balanced and healthy diet accommodates most foods and drinks . Consider portion sizes and frequency of consumption of certain foods   Meal Ideas & Options:  . Breakfast:  o Whole wheat toast or whole wheat English muffins with peanut butter & hard boiled egg o Steel cut oats topped with apples & cinnamon and skim milk  o Fresh fruit: banana, strawberries, melon, berries, peaches  o Smoothies: strawberries, bananas, greek yogurt, peanut butter o Low fat greek yogurt with blueberries and granola  o Egg white omelet with spinach and mushrooms o Breakfast couscous: whole wheat couscous, apricots, skim milk, cranberries  . Sandwiches:  o Hummus and grilled vegetables (peppers, zucchini, squash) on whole wheat bread   o Grilled chicken on whole wheat pita with lettuce, tomatoes, cucumbers or tzatziki  o Tuna salad on whole wheat bread: tuna salad made with greek yogurt, olives, red peppers, capers, green onions o Garlic rosemary lamb pita: lamb sauted with garlic, rosemary, salt & pepper; add lettuce, cucumber, greek yogurt to pita - flavor with lemon juice and black pepper  . Seafood:  o Mediterranean grilled salmon, seasoned with garlic, basil, parsley, lemon juice and black pepper o Shrimp, lemon, and spinach whole-grain pasta salad made with low fat greek yogurt  o Seared scallops with lemon orzo  o Seared tuna steaks seasoned salt, pepper, coriander topped with tomato mixture of olives, tomatoes, olive oil, minced garlic, parsley, green onions and cappers  . Meats:  o Herbed greek chicken salad with kalamata olives, cucumber, feta  o Red bell peppers stuffed with spinach, bulgur, lean ground beef (or lentils) & topped with feta   o Kebabs: skewers of chicken, tomatoes, onions, zucchini, squash  o [place] burgers: made with red onions, mint, dill,  lemon juice, feta cheese topped with roasted red peppers . Vegetarian o Cucumber salad: cucumbers, artichoke hearts, celery, red onion, feta cheese, tossed in olive oil & lemon juice  o Hummus and whole grain pita points with a greek salad (lettuce, tomato, feta, olives, cucumbers, red onion) o Lentil soup with celery, carrots made with vegetable broth, garlic, salt and pepper  o Tabouli salad: parsley, bulgur, mint, scallions, cucumbers, tomato, radishes, lemon juice, olive oil, salt and pepper.

## 2014-05-23 NOTE — Progress Notes (Signed)
Pre visit review using our clinic review tool, if applicable. No additional management support is needed unless otherwise documented below in the visit note.  Chief Complaint  Patient presents with  . Follow-up    meds acne asd    HPI: Barbera Settersndrew E Korenek 24 y.o.  With high functioning ASD  On medication  [place] also on acne med Differin at night seems to help a good bit. ocass exercise no major change in health functioning   Can make some of own foods pizza sandwich other wise mom cooks.  ROS: See pertinent positives and negatives per HPI. 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 Autism   . Allergic rhinitis   . Varicella   . Acne   . FRACTURE, ARM, RIGHT 07/18/2008    Qualifier: History of  By: Fabian SharpPanosh MD, Neta MendsWanda K     Family History  Problem Relation Age of Onset  . Diverticulosis Mother   . Depression Father     History   Social History  . Marital Status: Single    Spouse Name: N/A  . Number of Children: N/A  . Years of Education: N/A   Social History Main Topics  . Smoking status: Never Smoker   . Smokeless tobacco: Not on file  . Alcohol Use: No  . Drug Use: Not on file  . Sexual Activity: Not on file   Other Topics Concern  . None   Social History Narrative   Lives at home with parents is working 3 days a week his grades were [employer]      Older brother          Outpatient Encounter Prescriptions as of 05/23/2014  Medication Sig  . adapalene (DIFFERIN) 0.1 % cream Apply topically at bedtime. Pea sized amount to affected area  As directed  . escitalopram (LEXAPRO) 20 MG tablet TAKE 2 TABLETS EVERY MORNING  . guanFACINE (TENEX) 1 MG tablet TAKE 3/4 TABLET 2 TIMES A DAY.  . valACYclovir (VALTREX) 1000 MG tablet Take 1 tablet (1,000 mg total) by mouth daily.    EXAM:  BP 100/60 mmHg  Temp(Src)  98.5 F (36.9 C) (Oral)  Ht 5\' 9"  (1.753 m)  Wt 202 lb 14.4 oz (92.035 kg)  BMI 29.95 kg/m2  Body mass index is 29.95 kg/(m^2).  GENERAL: vitals reviewed and listed above, alert, oriented, appears well hydrated and in no acute distress HEENT: atraumatic, conjunctiva  clear, no obvious abnormalities on inspection of external nose and ears OP : no lesion edema or exudate  NECK: no obvious masses on inspection palpation  LUNGS: clear to auscultation bilaterally, no wheezes, rales or rhonchi, good air movement CV: HRRR, no clubbing cyanosis or  peripheral edema nl cap refill  Skin few acne lsion fac back   Abdomen:  Sof,t normal bowel sounds without hepatosplenomegaly, no guarding rebound or masses no CVA tenderness MS: moves all extremities without noticeable focal  abnormality PSYCH: pleasant and cooperative, no obvious depression or anxiety  ASSESSMENT AND PLAN:  Discussed the following assessment and plan:  Autism spectrum disorder - continuesame meds for now  Acne vulgaris - stable refill med when needed  Preventive measure - Plan: Basic metabolic panel, CBC with Differential/Platelet, Hepatic function panel, Lipid panel, TSH  Medication management - Plan: Basic metabolic panel, CBC with Differential/Platelet, Hepatic function panel, Lipid panel, TSH Counseled.  healthy eating  Increase acitiviy -Patient advised to return or notify health care team  if symptoms worsen ,persist or new concerns arise.  Patient Instructions   Continue lifestyle intervention healthy eating and exercise . Increase exercise . As tolerated   Ok to refill meds when due  Wellness visit in a year.      Why follow it? Research shows. . Those who follow the Mediterranean diet have a reduced risk of heart disease  . The diet is associated with a reduced incidence of Parkinson's and Alzheimer's diseases . People following the diet may have longer life expectancies and lower rates of chronic diseases   . The Dietary Guidelines for Americans recommends the Mediterranean diet as an eating plan to promote health and prevent disease  What Is the Mediterranean Diet?  . Healthy eating plan based on typical foods and recipes of Mediterranean-style cooking . The diet is primarily a plant based diet; these foods should make up a majority of meals   Starches - Plant based foods should make up a majority of meals - They are an important sources of vitamins, minerals, energy, antioxidants, and fiber - Choose whole grains, foods high in fiber and minimally processed items  - Typical grain sources include wheat, oats, barley, corn, brown rice, bulgar, farro, millet, polenta, couscous  - Various types of beans include chickpeas, lentils, fava beans, black beans, white beans   Fruits  Veggies - Large quantities of antioxidant rich fruits & veggies; 6 or more servings  - Vegetables can be eaten raw or lightly drizzled with oil and cooked  - Vegetables common to the traditional Mediterranean Diet include: artichokes, arugula, beets, broccoli, brussel sprouts, cabbage, carrots, celery, collard greens, cucumbers, eggplant, kale, leeks, lemons, lettuce, mushrooms, okra, onions, peas, peppers, potatoes, pumpkin, radishes, rutabaga, shallots, spinach, sweet potatoes, turnips, zucchini - Fruits common to the Mediterranean Diet include: apples, apricots, avocados, cherries, clementines, dates, figs, grapefruits, grapes, melons, nectarines, oranges, peaches, pears, pomegranates, strawberries, tangerines  Fats - Replace butter and margarine with healthy oils, such as olive oil, canola oil, and tahini  - Limit nuts to no more than a handful a day  - Nuts include walnuts, almonds, pecans, pistachios, pine nuts  - Limit or avoid candied, honey roasted or heavily salted nuts - Olives are central to the [employer] - can be eaten whole or used in a variety of dishes   Meats Protein - Limiting red meat: no more than a  few times a month - When eating red meat: choose lean cuts and keep the portion to the size of deck of cards - Eggs: approx. 0 to 4 times a week  - Fish and lean poultry: at least 2 a week  - Healthy protein sources include, chicken, [place], lean beef, lamb - Increase intake of seafood such as tuna, salmon, trout, mackerel, shrimp, scallops - Avoid or limit high fat processed meats such as sausage and bacon  Dairy - Include moderate amounts of low fat dairy products  - Focus on healthy dairy such as fat free yogurt, skim milk, low or reduced fat cheese - Limit dairy products higher in fat such as whole or 2% milk, cheese, ice cream  Alcohol - Moderate amounts of red wine is ok  - No more than 5 oz daily for women (all ages) and men older than age 26  - No more than 10 oz of wine daily for men younger than 59  Other - Limit sweets and other  desserts  - Use herbs and spices instead of salt to flavor foods  - Herbs and spices common to the traditional Mediterranean Diet include: basil, bay leaves, chives, cloves, cumin, fennel, garlic, lavender, marjoram, mint, oregano, parsley, pepper, rosemary, sage, savory, sumac, tarragon, thyme   It's not just a diet, it's a lifestyle:  . The Mediterranean diet includes lifestyle factors typical of those in the region  . Foods, drinks and meals are best eaten with others and savored . Daily physical activity is important for overall good health . This could be strenuous exercise like running and aerobics . This could also be more leisurely activities such as walking, housework, yard-work, or taking the stairs . Moderation is the key; a balanced and healthy diet accommodates most foods and drinks . Consider portion sizes and frequency of consumption of certain foods   Meal Ideas & Options:  . Breakfast:  o Whole wheat toast or whole wheat English muffins with peanut butter & hard boiled egg o Steel cut oats topped with apples & cinnamon and skim milk   o Fresh fruit: banana, strawberries, melon, berries, peaches  o Smoothies: strawberries, bananas, greek yogurt, peanut butter o Low fat greek yogurt with blueberries and granola  o Egg white omelet with spinach and mushrooms o Breakfast couscous: whole wheat couscous, apricots, skim milk, cranberries  . Sandwiches:  o Hummus and grilled vegetables (peppers, zucchini, squash) on whole wheat bread   o Grilled chicken on whole wheat pita with lettuce, tomatoes, cucumbers or tzatziki  o Tuna salad on whole wheat bread: tuna salad made with greek yogurt, olives, red peppers, capers, green onions o Garlic rosemary lamb pita: lamb sauted with garlic, rosemary, salt & pepper; add lettuce, cucumber, greek yogurt to pita - flavor with lemon juice and black pepper  . Seafood:  o Mediterranean grilled salmon, seasoned with garlic, basil, parsley, lemon juice and black pepper o Shrimp, lemon, and spinach whole-grain pasta salad made with low fat greek yogurt  o Seared scallops with lemon orzo  o Seared tuna steaks seasoned salt, pepper, coriander topped with tomato mixture of olives, tomatoes, olive oil, minced garlic, parsley, green onions and cappers  . Meats:  o Herbed greek chicken salad with kalamata olives, cucumber, feta  o Red bell peppers stuffed with spinach, bulgur, lean ground beef (or lentils) & topped with feta   o Kebabs: skewers of chicken, tomatoes, onions, zucchini, squash  o [place] burgers: made with red onions, mint, dill, lemon juice, feta cheese topped with roasted red peppers . Vegetarian o Cucumber salad: cucumbers, artichoke hearts, celery, red onion, feta cheese, tossed in olive oil & lemon juice  o Hummus and whole grain pita points with a greek salad (lettuce, tomato, feta, olives, cucumbers, red onion) o Lentil soup with celery, carrots made with vegetable broth, garlic, salt and pepper  o Tabouli salad: parsley, bulgur, mint, scallions, cucumbers, tomato, radishes, lemon  juice, olive oil, salt and pepper.          Neta Mends. Panosh M.D.

## 2014-06-06 ENCOUNTER — Other Ambulatory Visit: Payer: Self-pay | Admitting: Internal Medicine

## 2014-06-06 NOTE — Telephone Encounter (Signed)
Sent to the pharmacy by e-scribe. 

## 2014-11-14 ENCOUNTER — Other Ambulatory Visit: Payer: Self-pay | Admitting: Internal Medicine

## 2014-11-14 NOTE — Telephone Encounter (Signed)
Sent to the pharmacy by e-scribe. 

## 2014-12-21 ENCOUNTER — Other Ambulatory Visit: Payer: Self-pay | Admitting: Internal Medicine

## 2015-04-01 ENCOUNTER — Other Ambulatory Visit: Payer: Self-pay | Admitting: Internal Medicine

## 2015-04-02 ENCOUNTER — Other Ambulatory Visit: Payer: Self-pay | Admitting: Family Medicine

## 2015-04-02 ENCOUNTER — Telehealth: Payer: Self-pay | Admitting: Family Medicine

## 2015-04-02 ENCOUNTER — Other Ambulatory Visit: Payer: Self-pay | Admitting: Internal Medicine

## 2015-04-02 DIAGNOSIS — Z0000 Encounter for general adult medical examination without abnormal findings: Secondary | ICD-10-CM

## 2015-04-02 NOTE — Telephone Encounter (Signed)
No answer nor voice mail after several rings

## 2015-04-02 NOTE — Telephone Encounter (Signed)
Misty who is pt mom on EC is dad

## 2015-04-02 NOTE — Telephone Encounter (Signed)
Pt is due for for cpx and lab work in March 2017.  Please contact the patient's mom to help make both appointments.  I have placed the lab orders.

## 2015-04-02 NOTE — Telephone Encounter (Signed)
Brian Mcgrath, Brian Mcgrath [phone]

## 2015-04-02 NOTE — Telephone Encounter (Signed)
Sent to the pharmacy by e-scribe.  Message sent to scheduling to help make cpx and lab appts for 04/2015.

## 2015-04-03 NOTE — Telephone Encounter (Signed)
Pt has been sch

## 2015-04-26 ENCOUNTER — Other Ambulatory Visit (INDEPENDENT_AMBULATORY_CARE_PROVIDER_SITE_OTHER): Payer: BLUE CROSS/BLUE SHIELD

## 2015-04-26 DIAGNOSIS — Z0000 Encounter for general adult medical examination without abnormal findings: Secondary | ICD-10-CM

## 2015-04-26 LAB — CBC WITH DIFFERENTIAL/PLATELET
BASOS ABS: 0 10*3/uL (ref 0.0–0.1)
Basophils Relative: 0.3 % (ref 0.0–3.0)
EOS ABS: 0.1 10*3/uL (ref 0.0–0.7)
EOS PCT: 1.2 % (ref 0.0–5.0)
HEMATOCRIT: 43.1 % (ref 39.0–52.0)
Hemoglobin: 14.7 g/dL (ref 13.0–17.0)
LYMPHS ABS: 1.9 10*3/uL (ref 0.7–4.0)
Lymphocytes Relative: 38.6 % (ref 12.0–46.0)
MCHC: 34.1 g/dL (ref 30.0–36.0)
MCV: 90.5 fl (ref 78.0–100.0)
MONO ABS: 0.5 10*3/uL (ref 0.1–1.0)
MONOS PCT: 10.7 % (ref 3.0–12.0)
NEUTROS ABS: 2.5 10*3/uL (ref 1.4–7.7)
Neutrophils Relative %: 49.2 % (ref 43.0–77.0)
PLATELETS: 207 10*3/uL (ref 150.0–400.0)
RBC: 4.77 Mil/uL (ref 4.22–5.81)
RDW: 12.9 % (ref 11.5–15.5)
WBC: 5 10*3/uL (ref 4.0–10.5)

## 2015-04-26 LAB — BASIC METABOLIC PANEL
BUN: 14 mg/dL (ref 6–23)
CO2: 30 meq/L (ref 19–32)
Calcium: 9.3 mg/dL (ref 8.4–10.5)
Chloride: 106 mEq/L (ref 96–112)
Creatinine, Ser: 0.77 mg/dL (ref 0.40–1.50)
GFR: 130.12 mL/min (ref >60.00)
Glucose, Bld: 83 mg/dL (ref 70–99)
POTASSIUM: 3.7 meq/L (ref 3.5–5.1)
SODIUM: 142 meq/L (ref 135–145)

## 2015-04-26 LAB — LIPID PANEL
CHOL/HDL RATIO: 4
CHOLESTEROL: 143 mg/dL (ref 0–200)
HDL: 37.7 mg/dL — AB (ref >39.00)
LDL Cholesterol: 91 mg/dL (ref 0–99)
NonHDL: 105.2
TRIGLYCERIDES: 70 mg/dL (ref 0.0–149.0)
VLDL: 14 mg/dL (ref 0.0–40.0)

## 2015-04-26 LAB — HEPATIC FUNCTION PANEL
ALK PHOS: 49 U/L (ref 39–117)
ALT: 13 U/L (ref 0–53)
AST: 15 U/L (ref 0–37)
Albumin: 4.6 g/dL (ref 3.5–5.2)
BILIRUBIN DIRECT: 0.1 mg/dL (ref 0.0–0.3)
BILIRUBIN TOTAL: 0.5 mg/dL (ref 0.2–1.2)
TOTAL PROTEIN: 6.7 g/dL (ref 6.0–8.3)

## 2015-04-26 LAB — TSH: TSH: 1.94 u[IU]/mL (ref 0.35–4.50)

## 2015-04-30 NOTE — Progress Notes (Signed)
Chief Complaint  Patient presents with  . Annual Exam    HPI: Patient  Brian Mcgrath  25 y.o. comes in today w mom  for Preventive Health Care visit  More exercise and mom says making better food choices cutting out sugars . Has some inc balding   Acne controlled on current med   Health Maintenance  Topic Date Due  . HIV Screening  08/17/2004  . INFLUENZA VACCINE  09/24/2015  . TETANUS/TDAP  10/25/2019   Health Maintenance Review LIFESTYLE:  Exercise:  Weight  Tobacco/ETS:n Alcohol: n Sugar beverages:ocassto rare Sleep:good Drug use: no volunteers [employer]  ROS:  GEN/ HEENT: No fever, significant weight changes sweats headaches vision problems hearing changes, CV/ PULM; No chest pain shortness of breath cough, syncope,edema  change in exercise tolerance. GI /GU: No adominal pain, vomiting, change in bowel habits. No blood in the stool. No significant GU symptoms. SKIN/HEME: ,no acute skin rashes suspicious lesions or bleeding. No lymphadenopathy, nodules, masses.  NEURO/ PSYCH:  No neurologic signs such as weakness numbness. No depression anxiety. IMM/ Allergy: No unusual infections.  Allergy .   REST of 12 system review negative except as per HPI   Past Medical History  Diagnosis Date  . Autism   . Allergic rhinitis   . Varicella   . Acne   . FRACTURE, ARM, RIGHT 07/18/2008    Qualifier: History of  By: Fabian Sharp MD, Neta Mends     Past Surgical History  Procedure Laterality Date  . Rock removal from ear  1999  . Recircumcised    . Adenoidectomy    . Tonsillectomy  2003  . Fracture surgery      right arm     Family History  Problem Relation Age of Onset  . Diverticulosis Mother   . Depression Father     Social History   Social History  . Marital Status: Single    Spouse Name: N/A  . Number of Children: N/A  . Years of Education: N/A   Social History Main Topics  . Smoking status: Never Smoker   . Smokeless tobacco: None    . Alcohol Use: No  . Drug Use: None  . Sexual Activity: Not Asked   Other Topics Concern  . 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 adapalene (DIFFERIN) 0.1 % cream APPLY TOPICALLY AT BEDTIME. PEA SIZED AMOUNT TO AFFTECTED AREA AS DIRECTED. 45 g 0  . escitalopram (LEXAPRO) 20 MG tablet TAKE 2 TABLETS BY MOUTH EVERY MORNING 60 tablet 11  . guanFACINE (TENEX) 1 MG tablet TAKE 3/4 TABLET BY MOUTH TWICE DAILY 45 tablet 11  . valACYclovir (VALTREX) 1000 MG tablet TAKE 1 TABLET BY MOUTH ONCE DAILY 150 tablet 0   No facility-administered medications prior to visit.     EXAM:  BP 100/66 mmHg  Temp(Src) 98.6 F (37 C) (Oral)  Ht 5' 9.5" (1.765 m)  Wt 190 lb 12.8 oz (86.546 kg)  BMI 27.78 kg/m2  Body mass index is 27.78 kg/(m^2).  Physical Exam: Vital signs reviewed ZOX:WRUE is a well-developed well-nourished alert cooperative    who appearsr stated age in no acute distress.  HEENT: normocephalic atraumatic , Eyes: PERRL EOM's full, conjunctiva  clear, Nares: paten,t no deformity discharge or tenderness., Ears: no deformity EAC's clear TMs with normal landmarks. Mouth: clear OP, no lesions, edema.  Moist mucous membranes. Dentition in adequate repair. NECK: supple without masses, thyromegaly or bruits. CHEST/PULM:  Clear to auscultation and percussion breath sounds equal no wheeze , rales or rhonchi. No chest wall deformities or tenderness. CV: PMI is nondisplaced, S1 S2 no gallops, murmurs, rubs. Peripheral pulses are full without delay.No JVD .  ABDOMEN: Bowel sounds normal nontender  No guard or rebound, no hepato splenomegal no CVA tenderness.   Extremtities:  No clubbing cyanosis or edema, no acute joint swelling or redness no focal atrophy NEURO:  Oriented x3, cranial nerves 3-12  appear to be intact, no obvious focal weakness,gait within normal limits no abnormal reflexes or asymmetrical SKIN: No acute rashes normal turgor, color, no bruising or petechiae. Few acne lesions face   badling hair loss top of scalp no rashes noted  PSYCH: Oriented, , no obvious depression anxiety,  Cooperative   Flat autistic respctful speech LN: no cervical axillary inguinal adenopathy  Lab Results  Component Value Date   WBC 5.0 04/26/2015   HGB 14.7 04/26/2015   HCT 43.1 04/26/2015   PLT 207.0 04/26/2015   GLUCOSE 83 04/26/2015   CHOL 143 04/26/2015   TRIG 70.0 04/26/2015   HDL 37.70* 04/26/2015   LDLDIRECT 102.0 05/23/2014   LDLCALC 91 04/26/2015   ALT 13 04/26/2015   AST 15 04/26/2015   NA 142 04/26/2015   K 3.7 04/26/2015   CL 106 04/26/2015   CREATININE 0.77 04/26/2015   BUN 14 04/26/2015   CO2 30 04/26/2015   TSH 1.94 04/26/2015   BP Readings from Last 3 Encounters:  05/01/15 100/66  05/23/14 100/60  04/05/13 100/50   Wt Readings from Last 3 Encounters:  05/01/15 190 lb 12.8 oz (86.546 kg)  05/23/14 202 lb 14.4 oz (92.035 kg)  04/05/13 200 lb (90.719 kg)    ASSESSMENT AND PLAN:  Discussed the following assessment and plan:  Visit for preventive health examination  Medication management - seems to be doing well nocurrent meds   Autism spectrum disorder  Balding  Acne vulgaris - on differin compliance seems to be good Reviewed labs   Improved  Metabolic since last year  Continue lifestyle intervention healthy eating and exercise .  Consider minoxidil  For balding but would see derm for any further advice since this is occuring at such a young age. Have pharmacy contact us if needs refills of meds Patient Care Team: Madelin Headings, MD as PCP - General Patient Instructions    Health Maintenance, Male A healthy lifestyle and preventative care can promote health and wellness.  Maintain regular health, dental, and eye exams.  Eat a healthy diet.  Foods like vegetables, fruits, whole grains, low-fat dairy products, and lean protein foods contain the nutrients you need and are low in calories. Decrease your intake of foods high in solid fats, added sugars, and salt. Get information about a proper diet from your health care provider, if necessary.  Regular physical exercise is one of the most important things you can do for your health. Most adults should get at least 150 minutes of moderate-intensity exercise (any activity that increases your heart rate and causes you to sweat) each week. In addition, most adults need muscle-strengthening exercises on 2 or more days a week.   Maintain a healthy weight. The body mass index (BMI) is a screening tool to identify possible  weight problems. It provides an estimate of body fat based on height and weight. Your health care provider can find your BMI and can help you achieve or maintain a healthy weight. For males 20 years and older:  A BMI below 18.5 is considered underweight.  A BMI of 18.5 to 24.9 is normal.  A BMI of 25 to 29.9 is considered overweight.  A BMI of 30 and above is considered obese.  Maintain normal blood lipids and cholesterol by exercising and minimizing your intake of saturated fat. Eat a balanced diet with plenty of fruits and vegetables. Blood tests for lipids and cholesterol should begin at age 26 and be repeated every 5 years. If your lipid or cholesterol levels are high, you are over age 26, or you are at high risk for heart disease, you may need your cholesterol levels checked more frequently.Ongoing high lipid and cholesterol levels should be treated with medicines if diet and exercise are not working.  If you smoke, find out from your health care provider how to quit. If you do not use tobacco, do not start.  Lung cancer screening is recommended for adults aged 55-80 years who are at high risk for developing lung cancer because of a history of smoking. A yearly low-dose  CT scan of the lungs is recommended for people who have at least a 30-pack-year history of smoking and are current smokers or have quit within the past 15 years. A pack year of smoking is smoking an average of 1 pack of cigarettes a day for 1 year (for example, a 30-pack-year history of smoking could mean smoking 1 pack a day for 30 years or 2 packs a day for 15 years). Yearly screening should continue until the smoker has stopped smoking for at least 15 years. Yearly screening should be stopped for people who develop a health problem that would prevent them from having lung cancer treatment.  If you choose to drink alcohol, do not have more than 2 drinks per day. One drink is considered to be 12 oz (360 mL) of beer, 5 oz (150 mL) of wine, or 1.5 oz (45 mL) of liquor.  Avoid the use of street drugs. Do not share needles with anyone. Ask for help if you need support or instructions about stopping the use of drugs.  High blood pressure causes heart disease and increases the risk of stroke. High blood pressure is more likely to develop in:  People who have blood pressure in the end of the normal range (100-139/85-89 mm Hg).  People who are overweight or obese.  People who are African American.  If you are 7218-26 years of age, have your blood pressure checked every 3-5 years. If you are 26 years of age or older, have your blood pressure checked every year. You should have your blood pressure measured twice--once when you are at a hospital or clinic, and once when you are not at a hospital or clinic. Record the average of the two measurements. To check your blood pressure when you are not at a hospital or clinic, you can use:  An automated blood pressure machine at a pharmacy.  A home blood pressure monitor.  If you are 5445-26 years old, ask your health care provider if you should take aspirin to prevent heart disease.  Diabetes screening involves taking a blood sample to check your fasting blood  sugar level. This should be done once every 3 years after age 26 if you are at a  normal weight and without risk factors for diabetes. Testing should be considered at a younger age or be carried out more frequently if you are overweight and have at least 1 risk factor for diabetes.  Colorectal cancer can be detected and often prevented. Most routine colorectal cancer screening begins at the age of 55 and continues through age 48. However, your health care provider may recommend screening at an earlier age if you have risk factors for colon cancer. On a yearly basis, your health care provider may provide home test kits to check for hidden blood in the stool. A small camera at the end of a tube may be used to directly examine the colon (sigmoidoscopy or colonoscopy) to detect the earliest forms of colorectal cancer. Talk to your health care provider about this at age 87 when routine screening begins. A direct exam of the colon should be repeated every 5-10 years through age 90, unless early forms of precancerous polyps or small growths are found.  People who are at an increased risk for hepatitis B should be screened for this virus. You are considered at high risk for hepatitis B if:  You were born in a country where hepatitis B occurs often. Talk with your health care provider about which countries are considered high risk.  Your parents were born in a high-risk country and you have not received a shot to protect against hepatitis B (hepatitis B vaccine).  You have HIV or AIDS.  You use needles to inject street drugs.  You live with, or have sex with, someone who has hepatitis B.  You are a man who has sex with other men (MSM).  You get hemodialysis treatment.  You take certain medicines for conditions like cancer, organ transplantation, and autoimmune conditions.  Hepatitis C blood testing is recommended for all people born from [birth year] through 1966 and any individual with known risk factors for  hepatitis C.  Healthy men should no longer receive prostate-specific antigen (PSA) blood tests as part of routine cancer screening. Talk to your health care provider about prostate cancer screening.  Testicular cancer screening is not recommended for adolescents or adult males who have no symptoms. Screening includes self-exam, a health care provider exam, and other screening tests. Consult with your health care provider about any symptoms you have or any concerns you have about testicular cancer.  Practice safe sex. Use condoms and avoid high-risk sexual practices to reduce the spread of sexually transmitted infections (STIs).  You should be screened for STIs, including gonorrhea and chlamydia if:  You are sexually active and are younger than 24 years.  You are older than 24 years, and your health care provider tells you that you are at risk for this type of infection.  Your sexual activity has changed since you were last screened, and you are at an increased risk for chlamydia or gonorrhea. Ask your health care provider if you are at risk.  If you are at risk of being infected with HIV, it is recommended that you take a prescription medicine daily to prevent HIV infection. This is called pre-exposure prophylaxis (PrEP). You are considered at risk if:  You are a man who has sex with other men (MSM).  You are a heterosexual man who is sexually active with multiple partners.  You take drugs by injection.  You are sexually active with a partner who has HIV.  Talk with your health care provider about whether you are at high risk of being infected  with HIV. If you choose to begin PrEP, you should first be tested for HIV. You should then be tested every 3 months for as long as you are taking PrEP.  Use sunscreen. Apply sunscreen liberally and repeatedly throughout the day. You should seek shade when your shadow is shorter than you. Protect yourself by wearing long sleeves, pants, a  wide-brimmed hat, and sunglasses year round whenever you are outdoors.  Tell your health care provider of new moles or changes in moles, especially if there is a change in shape or color. Also, tell your health care provider if a mole is larger than the size of a pencil eraser.  A one-time screening for abdominal aortic aneurysm (AAA) and surgical repair of large AAAs by ultrasound is recommended for men aged 65-75 years who are current or former smokers.  Stay current with your vaccines (immunizations).   This information is not intended to replace advice given to you by your health care provider. Make sure you discuss any questions you have with your health care provider.   Document Released: 08/08/2007 Document Revised: 03/02/2014 Document Reviewed: 07/07/2010 Elsevier Interactive Patient Education [employer]     [place]. Denea Cheaney M.D.

## 2015-05-01 ENCOUNTER — Encounter: Payer: Self-pay | Admitting: Internal Medicine

## 2015-05-01 ENCOUNTER — Ambulatory Visit (INDEPENDENT_AMBULATORY_CARE_PROVIDER_SITE_OTHER): Payer: BLUE CROSS/BLUE SHIELD | Admitting: Internal Medicine

## 2015-05-01 VITALS — BP 100/66 | Temp 98.6°F | Ht 69.5 in | Wt 190.8 lb

## 2015-05-01 DIAGNOSIS — F840 Autistic disorder: Secondary | ICD-10-CM | POA: Diagnosis not present

## 2015-05-01 DIAGNOSIS — L659 Nonscarring hair loss, unspecified: Secondary | ICD-10-CM

## 2015-05-01 DIAGNOSIS — L700 Acne vulgaris: Secondary | ICD-10-CM | POA: Diagnosis not present

## 2015-05-01 DIAGNOSIS — Z0000 Encounter for general adult medical examination without abnormal findings: Secondary | ICD-10-CM

## 2015-05-01 DIAGNOSIS — Z79899 Other long term (current) drug therapy: Secondary | ICD-10-CM | POA: Diagnosis not present

## 2015-05-01 DIAGNOSIS — L658 Other specified nonscarring hair loss: Secondary | ICD-10-CM

## 2015-05-01 NOTE — Patient Instructions (Signed)

## 2015-06-08 ENCOUNTER — Other Ambulatory Visit: Payer: Self-pay | Admitting: Internal Medicine

## 2015-06-11 NOTE — Telephone Encounter (Signed)
Sent to the pharmacy by e-scribe. 

## 2015-09-27 ENCOUNTER — Other Ambulatory Visit: Payer: Self-pay | Admitting: Internal Medicine

## 2015-09-27 NOTE — Telephone Encounter (Signed)
Sent to the pharmacy by e-scribe. 

## 2015-10-08 ENCOUNTER — Other Ambulatory Visit: Payer: Self-pay | Admitting: Internal Medicine

## 2015-10-09 NOTE — Telephone Encounter (Signed)
Sent to the pharmacy by e-scribe. 

## 2016-03-05 ENCOUNTER — Other Ambulatory Visit: Payer: Self-pay | Admitting: Internal Medicine

## 2016-03-06 ENCOUNTER — Telehealth: Payer: Self-pay | Admitting: Family Medicine

## 2016-03-06 ENCOUNTER — Other Ambulatory Visit: Payer: Self-pay | Admitting: Family Medicine

## 2016-03-06 DIAGNOSIS — Z0000 Encounter for general adult medical examination without abnormal findings: Secondary | ICD-10-CM

## 2016-03-06 NOTE — Telephone Encounter (Signed)
Sent to the pharmacy by e-scribe.  Pt due for cpx and lab work in March. Message sent to scheduling.  Asked that patient's mom be contacted.

## 2016-03-06 NOTE — Telephone Encounter (Signed)
Pt is due for cpx and lab work in March 2018.  I have placed the lab orders.  Please help the pt to make both appointments.  Thanks!!!  Please call patient's mom.

## 2016-03-07 ENCOUNTER — Other Ambulatory Visit: Payer: Self-pay | Admitting: Internal Medicine

## 2016-03-09 NOTE — Telephone Encounter (Signed)
Sent to the pharmacy by e-scribe.  Pt has upcoming yearly scheduled for 05/06/16.

## 2016-03-09 NOTE — Telephone Encounter (Signed)
Pt has two pill left

## 2016-03-09 NOTE — Telephone Encounter (Signed)
Pt has been sch

## 2016-04-29 ENCOUNTER — Other Ambulatory Visit (INDEPENDENT_AMBULATORY_CARE_PROVIDER_SITE_OTHER): Payer: BLUE CROSS/BLUE SHIELD

## 2016-04-29 DIAGNOSIS — Z0000 Encounter for general adult medical examination without abnormal findings: Secondary | ICD-10-CM

## 2016-04-29 LAB — BASIC METABOLIC PANEL
BUN: 13 mg/dL (ref 6–23)
CALCIUM: 9.6 mg/dL (ref 8.4–10.5)
CO2: 33 meq/L — AB (ref 19–32)
CREATININE: 0.74 mg/dL (ref 0.40–1.50)
Chloride: 102 mEq/L (ref 96–112)
GFR: 135.16 mL/min (ref >60.00)
GLUCOSE: 82 mg/dL (ref 70–99)
Potassium: 4.2 mEq/L (ref 3.5–5.1)
SODIUM: 141 meq/L (ref 135–145)

## 2016-04-29 LAB — HEPATIC FUNCTION PANEL
ALBUMIN: 4.6 g/dL (ref 3.5–5.2)
ALT: 15 U/L (ref 0–53)
AST: 16 U/L (ref 0–37)
Alkaline Phosphatase: 57 U/L (ref 39–117)
BILIRUBIN DIRECT: 0.1 mg/dL (ref 0.0–0.3)
TOTAL PROTEIN: 6.7 g/dL (ref 6.0–8.3)
Total Bilirubin: 0.5 mg/dL (ref 0.2–1.2)

## 2016-04-29 LAB — CBC WITH DIFFERENTIAL/PLATELET
BASOS ABS: 0 10*3/uL (ref 0.0–0.1)
Basophils Relative: 0.2 % (ref 0.0–3.0)
EOS ABS: 0.1 10*3/uL (ref 0.0–0.7)
Eosinophils Relative: 1.5 % (ref 0.0–5.0)
HCT: 43.5 % (ref 39.0–52.0)
Hemoglobin: 14.8 g/dL (ref 13.0–17.0)
LYMPHS ABS: 1.7 10*3/uL (ref 0.7–4.0)
Lymphocytes Relative: 38.6 % (ref 12.0–46.0)
MCHC: 34 g/dL (ref 30.0–36.0)
MCV: 90.7 fl (ref 78.0–100.0)
MONO ABS: 0.5 10*3/uL (ref 0.1–1.0)
MONOS PCT: 11.7 % (ref 3.0–12.0)
NEUTROS ABS: 2.1 10*3/uL (ref 1.4–7.7)
NEUTROS PCT: 48 % (ref 43.0–77.0)
PLATELETS: 240 10*3/uL (ref 150.0–400.0)
RBC: 4.8 Mil/uL (ref 4.22–5.81)
RDW: 12.6 % (ref 11.5–15.5)
WBC: 4.4 10*3/uL (ref 4.0–10.5)

## 2016-04-29 LAB — LIPID PANEL
Cholesterol: 143 mg/dL (ref 0–200)
HDL: 38.2 mg/dL — AB (ref >39.00)
LDL Cholesterol: 89 mg/dL (ref 0–99)
NONHDL: 104.56
Total CHOL/HDL Ratio: 4
Triglycerides: 80 mg/dL (ref 0.0–149.0)
VLDL: 16 mg/dL (ref 0.0–40.0)

## 2016-04-29 LAB — TSH: TSH: 2.53 u[IU]/mL (ref 0.35–4.50)

## 2016-05-05 NOTE — Progress Notes (Signed)
Chief Complaint  Patient presents with  . Annual Exam    HPI: Patient  Brian Mcgrath  26 y.o. comes in today for Preventive Health Care visit  And med management  He has high functioning ASD   and is here with mom today  Has seen derm and on  Biotin other  No concerns Is still on same meds  lexa pro valtrex and  tenex   Health Maintenance  Topic Date Due  . HIV Screening  05/06/2017 (Originally 08/17/2004)  . TETANUS/TDAP  10/25/2019  . 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 GI /GU: No adominal pain, vomiting, change in bowel habits. No blood in the stool. No significant GU symptoms. SKIN/HEME: ,no acute skin rashes suspicious lesions or bleeding. No lymphadenopathy, nodules, masses.  NEURO/ PSYCH:  No neurologic signs such as weakness numbness. No depression anxiety. IMM/ Allergy: No unusual infections.  Allergy .   REST of 12 system review negative except as per HPI   Past Medical History:  Diagnosis Date  . Acne   . Allergic rhinitis   . Autism   . FRACTURE, ARM, RIGHT 07/18/2008   Qualifier: History of  By: Fabian SharpPanosh MD, Neta MendsWanda K   . Varicella     Past Surgical History:  Procedure Laterality Date  . ADENOIDECTOMY    . FRACTURE SURGERY     right arm   . recircumcised    . rock removal from ear  1999  . TONSILLECTOMY  2003    Family History  Problem Relation Age of Onset  . Diverticulosis Mother   . Depression Father     Social History   Social History  . Marital status: Single    Spouse name: N/A  .  Number of children: N/A  . Years of education: N/A   Social History Main Topics  . Smoking status: Never Smoker  . Smokeless tobacco: Never Used  . Alcohol use No  . Drug use: Unknown  . Sexual activity: Not Asked   Other Topics Concern  . None   Social History Narrative   Lives at home with parents is working 3 days a week  Plus other jobs  were [employer]   [employer]       Older brother          Outpatient Medications Prior to Visit  Medication Sig Dispense Refill  . adapalene (DIFFERIN) 0.1 % cream APPLY TOPICALLY AT BEDTIME. PEA SIZED AMOUNT TO AFFTECTED AREA AS DIRECTED. 45 g 0  . escitalopram (LEXAPRO) 20 MG tablet TAKE 2 TABLETS BY MOUTH EVERY MORNING. 180 tablet 0  . guanFACINE (TENEX) 1 MG tablet TAKE 3/4 TABLET BY MOUTH TWICE DAILY. 135 tablet 0  . valACYclovir (VALTREX) 1000 MG tablet TAKE 1 TABLET BY MOUTH ONCE DAILY 150 tablet 0   No facility-administered medications prior to visit.      EXAM:  BP 104/78 (BP Location: Right Arm, Patient Position: Sitting, Cuff Size: Normal)   Temp 97.5 F (36.4 C) (Oral)   Ht 5' 9.5" (1.765  m)   Wt 185 lb 9.6 oz (84.2 kg)   BMI 27.02 kg/m   Body mass index is 27.02 kg/m. Wt Readings from Last 3 Encounters:  05/06/16 185 lb 9.6 oz (84.2 kg)  05/01/15 190 lb 12.8 oz (86.5 kg)  05/23/14 202 lb 14.4 oz (92 kg)    Physical Exam: Vital signs reviewed ZOX:WRUE is a well-developed well-nourished alert cooperative    who appearsr stated age in no acute distress.  HEENT: normocephalic atraumatic , Eyes: PERRL EOM's full, conjunctiva clear, Nares: paten,t no deformity discharge or tenderness., Ears: no deformity EAC's clear TMs with normal landmarks. Mouth: clear OP, no lesions, edema.  Moist mucous membranes. Dentition in adequate repair. NECK: supple without masses, thyromegaly or bruits. CHEST/PULM:  Clear to auscultation and percussion breath sounds equal no wheeze , rales or rhonchi. No chest wall  deformities or tenderness. Breast: normal by inspection . No dimpling, discharge, masses, tenderness or discharge . CV: PMI is nondisplaced, S1 S2 no gallops, murmurs, rubs. Peripheral pulses are full without delay.No JVD .  ABDOMEN: Bowel sounds normal nontender  No guard or rebound, no hepato splenomegal no CVA tenderness.  No hernia. Extremtities:  No clubbing cyanosis or edema, no acute joint swelling or redness no focal atrophy NEURO:  Oriented x3, cranial nerves 3-12 appear to be intact, no obvious focal weakness,gait within normal limits no abnormal reflexes or asymmetrical SKIN: No acute rashes normal turgor, color, no bruising or petechiae. Acne faded minimal  Some balding  Nl hair distrib  PSYCH: Oriented, good eye contact, no obvious depression anxiety, cognition and judgment appear normal. LN: no cervical axillary inguinal adenopathy  Lab Results  Component Value Date   WBC 4.4 04/29/2016   HGB 14.8 04/29/2016   HCT 43.5 04/29/2016   PLT 240.0 04/29/2016   GLUCOSE 82 04/29/2016   CHOL 143 04/29/2016   TRIG 80.0 04/29/2016   HDL 38.20 (L) 04/29/2016   LDLDIRECT 102.0 05/23/2014   LDLCALC 89 04/29/2016   ALT 15 04/29/2016   AST 16 04/29/2016   NA 141 04/29/2016   K 4.2 04/29/2016   CL 102 04/29/2016   CREATININE 0.74 04/29/2016   BUN 13 04/29/2016   CO2 33 (H) 04/29/2016   TSH 2.53 04/29/2016    BP Readings from Last 3 Encounters:  05/06/16 104/78  05/01/15 100/66  05/23/14 100/60    Lab results reviewed with patient  And mom   ASSESSMENT AND PLAN:  Discussed the following assessment and plan:  Visit for preventive health examination - low hdl   Medication management  Autism spectrum disorder BMI is much better continue lifestyle with a borderline low HDL. No change in medications checkup and lab check with medication check in a year or as needed. He is up-to-date on health care parameters. Patient Care Team: Madelin Headings, MD as PCP - General Patient  Instructions   Continue lifestyle i healthy eating and exercise . You are doing great! Get your sleep as you are doing .  Wt Readings from Last 3 Encounters:  05/06/16 185 lb 9.6 oz (84.2 kg)  05/01/15 190 lb 12.8 oz (86.5 kg)  05/23/14 202 lb 14.4 oz (92 kg)           Kenzie Thoreson K. Jimeka Balan M.D.

## 2016-05-06 ENCOUNTER — Encounter: Payer: Self-pay | Admitting: Internal Medicine

## 2016-05-06 ENCOUNTER — Ambulatory Visit (INDEPENDENT_AMBULATORY_CARE_PROVIDER_SITE_OTHER): Payer: BLUE CROSS/BLUE SHIELD | Admitting: Internal Medicine

## 2016-05-06 VITALS — BP 104/78 | Temp 97.5°F | Ht 69.5 in | Wt 185.6 lb

## 2016-05-06 DIAGNOSIS — Z79899 Other long term (current) drug therapy: Secondary | ICD-10-CM | POA: Diagnosis not present

## 2016-05-06 DIAGNOSIS — Z0000 Encounter for general adult medical examination without abnormal findings: Secondary | ICD-10-CM

## 2016-05-06 DIAGNOSIS — F840 Autistic disorder: Secondary | ICD-10-CM

## 2016-05-06 NOTE — Patient Instructions (Signed)
Continue lifestyle i healthy eating and exercise . You are doing great! Get your sleep as you are doing .  Wt Readings from Last 3 Encounters:  05/06/16 185 lb 9.6 oz (84.2 kg)  05/01/15 190 lb 12.8 oz (86.5 kg)  05/23/14 202 lb 14.4 oz (92 kg)

## 2016-06-03 ENCOUNTER — Other Ambulatory Visit: Payer: Self-pay | Admitting: Internal Medicine

## 2016-06-04 ENCOUNTER — Other Ambulatory Visit: Payer: Self-pay | Admitting: Internal Medicine

## 2016-08-01 ENCOUNTER — Other Ambulatory Visit: Payer: Self-pay | Admitting: Internal Medicine

## 2016-08-27 ENCOUNTER — Other Ambulatory Visit: Payer: Self-pay | Admitting: Internal Medicine

## 2016-11-13 ENCOUNTER — Encounter: Payer: Self-pay | Admitting: Internal Medicine

## 2016-11-25 ENCOUNTER — Other Ambulatory Visit: Payer: Self-pay | Admitting: Internal Medicine

## 2016-12-29 ENCOUNTER — Other Ambulatory Visit: Payer: Self-pay | Admitting: Internal Medicine

## 2017-02-27 ENCOUNTER — Other Ambulatory Visit: Payer: Self-pay | Admitting: Internal Medicine

## 2017-03-02 NOTE — Telephone Encounter (Signed)
Tenex 1mg  last filled 11/24/16, #135 x 0rf Last seen 04/2016 for CPE Please advise Dr Fabian SharpPanosh, thanks.

## 2017-03-02 NOTE — Telephone Encounter (Signed)
Ok to refill x 90 days   cpx or yearly visit  Due march or  before  Runs out  Please have them make appt  for such

## 2017-03-03 NOTE — Telephone Encounter (Signed)
Refill sent.  Notes on Rx for appt to be scheduled.  Nothing further needed.

## 2017-06-06 ENCOUNTER — Other Ambulatory Visit: Payer: Self-pay | Admitting: Internal Medicine

## 2017-06-08 NOTE — Progress Notes (Signed)
Chief Complaint  Patient presents with  . Annual Exam    Left knee locking and pain, once resolved pain is gone. // Rash around eyes x 1 month    HPI: Patient  Brian Mcgrath  27 y.o. comes in today for Preventive Health Care visit   Seen alone and then with mom   Has been implementing healthy eating and activity  sice last visti  Working goes to gyme  And has trainer .  No injury but episodes of left knee  Cracking and ? Locking  Or not   No instability or swelling  Points to anterior knee   Need refill of   Acne topical   Health Maintenance  Topic Date Due  . HIV Screening  08/17/2004  . INFLUENZA VACCINE  09/23/2017  . TETANUS/TDAP  10/25/2019   Health Maintenance Review LIFESTYLE:  Exercise:   Goes to gyme.  Tobacco/ETS:no Alcohol:  no Sugar beverages:  no Sleep:ok  Drug use: no HH of  Work:  [employer]    [employer] 20 - 30 hours per week .   ROS:  GEN/ HEENT: No fever, significant weight changes sweats headaches vision problems hearing changes, CV/ PULM; No chest pain shortness of breath cough, syncope,edema  change in exercise tolerance. GI /GU: No adominal pain, vomiting, change in bowel habits. No blood in the stool. No significant GU symptoms. SKIN/HEME: ,no acute skin rashes suspicious lesions or bleeding. No lymphadenopathy, nodules, masses.  NEURO/ PSYCH:  No neurologic signs such as weakness numbness. No depression anxiety. IMM/ Allergy: No unusual infections. Marland Kitchen.   REST of 12 system review negative except as per HPI   Past Medical History:  Diagnosis Date  . Acne   . Allergic rhinitis   . Autism   . FRACTURE, ARM, RIGHT 07/18/2008   Qualifier: History of  By: Fabian SharpPanosh MD, Neta MendsWanda K   . Varicella     Past Surgical History:  Procedure Laterality Date  . ADENOIDECTOMY    . FRACTURE SURGERY     right arm   . recircumcised    . rock removal from ear  1999  . TONSILLECTOMY  2003    Family History  Problem Relation Age of Onset  .  Diverticulosis Mother   . Depression Father     Social History   Socioeconomic History  . Marital status: Single    Spouse name: Not on file  . Number of children: Not on file  . Years of education: Not on file  . Highest education level: Not on file  Occupational History  . Not on file  Social Needs  . Financial resource strain: Not on file  . Food insecurity:    Worry: Not on file    Inability: Not on file  . Transportation needs:    Medical: Not on file    Non-medical: Not on file  Tobacco Use  . Smoking status: Never Smoker  . Smokeless tobacco: Never Used  Substance and Sexual Activity  . Alcohol use: No  . Drug use: Not on file  . Sexual activity: Not on file  Lifestyle  . Physical activity:    Days per week: Not on file    Minutes per session: Not on file  . Stress: Not on file  Relationships  . Social connections:    Talks on phone: Not on file    Gets together: Not on file    Attends religious service: Not on file    Active member  of club or organization: Not on file    Attends meetings of clubs or organizations: Not on file    Relationship status: Not on file  Other Topics Concern  . 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 guanFACINE (TENEX) 1 MG tablet Take 3/4 tablet by mouth twice daily. Please schedule OV for further refills. Thanks. 135 tablet 0  . valACYclovir (VALTREX) 1000 MG tablet TAKE 1 TABLET BY MOUTH ONCE DAILY 150 tablet 0  . adapalene (DIFFERIN) 0.1 % cream APPLY TOPICALLY AT BEDTIME. PEA SIZED AMOUNT TO AFFTECTED AREA AS DIRECTED. 45 g 0  . escitalopram (LEXAPRO) 20 MG tablet TAKE 2 TABLETS BY MOUTH EVERY MORNING. 180 tablet 0   No facility-administered medications prior to visit.      EXAM:  BP 128/68 (BP Location:  Left Arm, Patient Position: Sitting, Cuff Size: Normal)   Pulse 79   Temp 98.1 F (36.7 C) (Oral)   Ht 5\' 9"  (1.753 m)   Wt 179 lb 1.6 oz (81.2 kg)   BMI 26.45 kg/m   Body mass index is 26.45 kg/m. Wt Readings from Last 3 Encounters:  06/09/17 179 lb 1.6 oz (81.2 kg)  05/06/16 185 lb 9.6 oz (84.2 kg)  05/01/15 190 lb 12.8 oz (86.5 kg)    Physical Exam: Vital signs reviewed [id number] is a well-developed well-nourished alert cooperative    who appearsr stated age in no acute distress. Cooperative  Felt uncomfotable getting in gown so  Part undressed and ade3quate exam  HEENT: normocephalic atraumatic , Eyes: PERRL EOM's full, conjunctiva clear, Nares: paten,t no deformity discharge or tenderness., Ears: no deformity EAC's clear TMs with normal landmarks. Mouth: clear OP, no lesions, edema.  Moist mucous membranes. Dentition in adequate repair. NECK: supple without masses, thyromegaly or bruits. CHEST/PULM:  Clear to auscultation and percussion breath sounds equal no wheeze , rales or rhonchi.  CV: PMI is nondisplaced, S1 S2 no gallops, murmurs, rubs. Peripheral pulses are full without delay.No JVD .  ABDOMEN: Bowel sounds normal nontender  No guard or rebound, no hepato splenomegal no CVA tenderness.  gu declined  Nl in past says ok . Extremtities:  No clubbing cyanosis or edema, no acute joint swelling or redness no focal atrophy Knees nl  Some creptius  Nl stability  Neg lachman  Nl gait  Points to anteriro knee as are of issues  NEURO:  Oriented x3, cranial nerves 3-12 appear to be intact, no obvious focal weakness,gait within normal limits no abnormal reflexes or asymmetrical  Autistic  Speech polite and interactive  SKIN: No acute rashes normal turgor, color, no bruising or petechiae. Face acne is pretty clear   LN: no cervical axillary  adenopathy  Lab Results  Component Value Date   WBC 4.4 04/29/2016   HGB 14.8 04/29/2016   HCT 43.5 04/29/2016   PLT 240.0 04/29/2016    GLUCOSE 82 04/29/2016   CHOL 143 04/29/2016   TRIG 80.0 04/29/2016   HDL 38.20 (L) 04/29/2016   LDLDIRECT 102.0 05/23/2014   LDLCALC 89 04/29/2016   ALT 15 04/29/2016   AST 16 04/29/2016   NA 141 04/29/2016   K 4.2 04/29/2016   CL 102 04/29/2016   CREATININE  0.74 04/29/2016   BUN 13 04/29/2016   CO2 33 (H) 04/29/2016   TSH 2.53 04/29/2016    BP Readings from Last 3 Encounters:  06/09/17 128/68  05/06/16 104/78  05/01/15 100/66   Wt Readings from Last 3 Encounters:  06/09/17 179 lb 1.6 oz (81.2 kg)  05/06/16 185 lb 9.6 oz (84.2 kg)  05/01/15 190 lb 12.8 oz (86.5 kg)    Lab results reviewed with patient   ASSESSMENT AND PLAN:  Discussed the following assessment and plan:  Visit for preventive health examination  Medication management  Autism spectrum disorder  Knee pain, unspecified chronicity, unspecified laterality - reports anterior knee discomfort  but mom reports hx of locking  ? cartilage i sue currently doing ok can refer if recurrent locking   Acne, unspecified acne type Weight has come down to normal  And  Complemented on this  improvement   Mo masks about dec med in future  Advise poss  The valtrex first and then to discuss others   Will have to wean  Plan lipid and lab monitoring next year or a s needed If knee issue   persistent or progressive woul see SM or ortho Refilled acne med today seems in control Patient Care Team: Madelin Headings, MD as PCP - General Patient Instructions    Continue lifestyle intervention healthy eating and exercise .  Knee looks ok    Exercises .     If  Progressive  Then can see  sports medicine  Can do   Blood work next year     Health Maintenance, Male A healthy lifestyle and preventive care is important for your health and wellness. Ask your health care provider about what schedule of regular examinations is right for you. What should I know about weight and diet? Eat a Healthy Diet  Eat plenty of vegetables,  fruits, whole grains, low-fat dairy products, and lean protein.  Do not eat a lot of foods high in solid fats, added sugars, or salt.  Maintain a Healthy Weight Regular exercise can help you achieve or maintain a healthy weight. You should:  Do at least 150 minutes of exercise each week. The exercise should increase your heart rate and make you sweat (moderate-intensity exercise).  Do strength-training exercises at least twice a week.  Watch Your Levels of Cholesterol and Blood Lipids  Have your blood tested for lipids and cholesterol every 5 years starting at 28 years of age. If you are at high risk for heart disease, you should start having your blood tested when you are 28 years old. You may need to have your cholesterol levels checked more often if: ? Your lipid or cholesterol levels are high. ? You are older than 28 years of age. ? You are at high risk for heart disease.  What should I know about cancer screening? Many types of cancers can be detected early and may often be prevented. Lung Cancer  You should be screened every year for lung cancer if: ? You are a current smoker who has smoked for at least 30 years. ? You are a former smoker who has quit within the past 15 years.  Talk to your health care provider about your screening options, when you should start screening, and how often you should be screened.  Colorectal Cancer  Routine colorectal cancer screening usually begins at 28 years of age and should be repeated every 5-10 years until you are 28 years old. You may need to  be screened more often if early forms of precancerous polyps or small growths are found. Your health care provider may recommend screening at an earlier age if you have risk factors for colon cancer.  Your health care provider may recommend using home test kits to check for hidden blood in the stool.  A small camera at the end of a tube can be used to examine your colon (sigmoidoscopy or  colonoscopy). This checks for the earliest forms of colorectal cancer.  Prostate and Testicular Cancer  Depending on your age and overall health, your health care provider may do certain tests to screen for prostate and testicular cancer.  Talk to your health care provider about any symptoms or concerns you have about testicular or prostate cancer.  Skin Cancer  Check your skin from head to toe regularly.  Tell your health care provider about any new moles or changes in moles, especially if: ? There is a change in a mole's size, shape, or color. ? You have a mole that is larger than a pencil eraser.  Always use sunscreen. Apply sunscreen liberally and repeat throughout the day.  Protect yourself by wearing long sleeves, pants, a wide-brimmed hat, and sunglasses when outside.  What should I know about heart disease, diabetes, and high blood pressure?  If you are 54-8 years of age, have your blood pressure checked every 3-5 years. If you are 27 years of age or older, have your blood pressure checked every year. You should have your blood pressure measured twice-once when you are at a hospital or clinic, and once when you are not at a hospital or clinic. Record the average of the two measurements. To check your blood pressure when you are not at a hospital or clinic, you can use: ? An automated blood pressure machine at a pharmacy. ? A home blood pressure monitor.  Talk to your health care provider about your target blood pressure.  If you are between 52-63 years old, ask your health care provider if you should take aspirin to prevent heart disease.  Have regular diabetes screenings by checking your fasting blood sugar level. ? If you are at a normal weight and have a low risk for diabetes, have this test once every three years after the age of 73. ? If you are overweight and have a high risk for diabetes, consider being tested at a younger age or more often.  A one-time screening for  abdominal aortic aneurysm (AAA) by ultrasound is recommended for men aged 65-75 years who are current or former smokers. What should I know about preventing infection? Hepatitis B If you have a higher risk for hepatitis B, you should be screened for this virus. Talk with your health care provider to find out if you are at risk for hepatitis B infection. Hepatitis C Blood testing is recommended for:  Everyone born from [birth year] through 1966.  Anyone with known risk factors for hepatitis C.  Sexually Transmitted Diseases (STDs)  You should be screened each year for STDs including gonorrhea and chlamydia if: ? You are sexually active and are younger than 28 years of age. ? You are older than 28 years of age and your health care provider tells you that you are at risk for this type of infection. ? Your sexual activity has changed since you were last screened and you are at an increased risk for chlamydia or gonorrhea. Ask your health care provider if you are at risk.  Talk with your  health care provider about whether you are at high risk of being infected with HIV. Your health care provider may recommend a prescription medicine to help prevent HIV infection.  What else can I do?  Schedule regular health, dental, and eye exams.  Stay current with your vaccines (immunizations).  Do not use any tobacco products, such as cigarettes, chewing tobacco, and e-cigarettes. If you need help quitting, ask your health care provider.  Limit alcohol intake to no more than 2 drinks per day. One drink equals 12 ounces of beer, 5 ounces of wine, or 1 ounces of hard liquor.  Do not use street drugs.  Do not share needles.  Ask your health care provider for help if you need support or information about quitting drugs.  Tell your health care provider if you often feel depressed.  Tell your health care provider if you have ever been abused or do not feel safe at home. This information is not intended to  replace advice given to you by your health care provider. Make sure you discuss any questions you have with your health care provider. Document Released: 08/08/2007 Document Revised: 10/09/2015 Document Reviewed: 11/13/2014 Elsevier Interactive Patient Education  2018 [employer].    Knee Exercises Ask your health care provider which exercises are safe for you. Do exercises exactly as told by your health care provider and adjust them as directed. It is normal to feel mild stretching, pulling, tightness, or discomfort as you do these exercises, but you should stop right away if you feel sudden pain or your pain gets worse.Do not begin these exercises until told by your health care provider. STRETCHING AND RANGE OF MOTION EXERCISES These exercises warm up your muscles and joints and improve the movement and flexibility of your knee. These exercises also help to relieve pain, numbness, and tingling. Exercise A: Knee Extension, Prone 1. Lie on your abdomen on a bed. 2. Place your left / right knee just beyond the edge of the surface so your knee is not on the bed. You can put a towel under your left / right thigh just above your knee for comfort. 3. Relax your leg muscles and allow gravity to straighten your knee. You should feel a stretch behind your left / right knee. 4. Hold this position for __________ seconds. 5. Scoot up so your knee is supported between repetitions. Repeat __________ times. Complete this stretch __________ times a day. Exercise B: Knee Flexion, Active  1. Lie on your back with both knees straight. If this causes back discomfort, bend your left / right knee so your foot is flat on the floor. 2. Slowly slide your left / right heel back toward your buttocks until you feel a gentle stretch in the front of your knee or thigh. 3. Hold this position for __________ seconds. 4. Slowly slide your left / right heel back to the starting position. Repeat __________ times. Complete  this exercise __________ times a day. Exercise C: Quadriceps, Prone  1. Lie on your abdomen on a firm surface, such as a bed or padded floor. 2. Bend your left / right knee and hold your ankle. If you cannot reach your ankle or pant leg, loop a belt around your foot and grab the belt instead. 3. Gently pull your heel toward your buttocks. Your knee should not slide out to the side. You should feel a stretch in the front of your thigh and knee. 4. Hold this position for __________ seconds. Repeat __________ times.  Complete this stretch __________ times a day. Exercise D: Hamstring, Supine 1. Lie on your back. 2. Loop a belt or towel over the ball of your left / right foot. The ball of your foot is on the walking surface, right under your toes. 3. Straighten your left / right knee and slowly pull on the belt to raise your leg until you feel a gentle stretch behind your knee. ? Do not let your left / right knee bend while you do this. ? Keep your other leg flat on the floor. 4. Hold this position for __________ seconds. Repeat __________ times. Complete this stretch __________ times a day. STRENGTHENING EXERCISES These exercises build strength and endurance in your knee. Endurance is the ability to use your muscles for a long time, even after they get tired. Exercise E: Quadriceps, Isometric  1. Lie on your back with your left / right leg extended and your other knee bent. Put a rolled towel or small pillow under your knee if told by your health care provider. 2. Slowly tense the muscles in the front of your left / right thigh. You should see your kneecap slide up toward your hip or see increased dimpling just above the knee. This motion will push the back of the knee toward the floor. 3. For __________ seconds, keep the muscle as tight as you can without increasing your pain. 4. Relax the muscles slowly and completely. Repeat __________ times. Complete this exercise __________ times a  day. Exercise F: Straight Leg Raises - Quadriceps 1. Lie on your back with your left / right leg extended and your other knee bent. 2. Tense the muscles in the front of your left / right thigh. You should see your kneecap slide up or see increased dimpling just above the knee. Your thigh may even shake a bit. 3. Keep these muscles tight as you raise your leg 4-6 inches (10-15 cm) off the floor. Do not let your knee bend. 4. Hold this position for __________ seconds. 5. Keep these muscles tense as you lower your leg. 6. Relax your muscles slowly and completely after each repetition. Repeat __________ times. Complete this exercise __________ times a day. Exercise G: Hamstring, Isometric 1. Lie on your back on a firm surface. 2. Bend your left / right knee approximately __________ degrees. 3. Dig your left / right heel into the surface as if you are trying to pull it toward your buttocks. Tighten the muscles in the back of your thighs to dig as hard as you can without increasing any pain. 4. Hold this position for __________ seconds. 5. Release the tension gradually and allow your muscles to relax completely for __________ seconds after each repetition. Repeat __________ times. Complete this exercise __________ times a day. Exercise H: Hamstring Curls  If told by your health care provider, do this exercise while wearing ankle weights. Begin with __________ weights. Then increase the weight by 1 lb (0.5 kg) increments. Do not wear ankle weights that are more than __________. 1. Lie on your abdomen with your legs straight. 2. Bend your left / right knee as far as you can without feeling pain. Keep your hips flat against the floor. 3. Hold this position for __________ seconds. 4. Slowly lower your leg to the starting position.  Repeat __________ times. Complete this exercise __________ times a day. Exercise I: Squats (Quadriceps) 1. Stand in front of a table, with your feet and knees pointing  straight ahead. You may rest your hands  on the table for balance but not for support. 2. Slowly bend your knees and lower your hips like you are going to sit in a chair. ? Keep your weight over your heels, not over your toes. ? Keep your lower legs upright so they are parallel with the table legs. ? Do not let your hips go lower than your knees. ? Do not bend lower than told by your health care provider. ? If your knee pain increases, do not bend as low. 3. Hold the squat position for __________ seconds. 4. Slowly push with your legs to return to standing. Do not use your hands to pull yourself to standing. Repeat __________ times. Complete this exercise __________ times a day. Exercise J: Wall Slides (Quadriceps)  1. Lean your back against a smooth wall or door while you walk your feet out 18-24 inches (46-61 cm) from it. 2. Place your feet hip-width apart. 3. Slowly slide down the wall or door until your knees bend __________ degrees. Keep your knees over your heels, not over your toes. Keep your knees in line with your hips. 4. Hold for __________ seconds. Repeat __________ times. Complete this exercise __________ times a day. Exercise K: Straight Leg Raises - Hip Abductors 1. Lie on your side with your left / right leg in the top position. Lie so your head, shoulder, knee, and hip line up. You may bend your bottom knee to help you keep your balance. 2. Roll your hips slightly forward so your hips are stacked directly over each other and your left / right knee is facing forward. 3. Leading with your heel, lift your top leg 4-6 inches (10-15 cm). You should feel the muscles in your outer hip lifting. ? Do not let your foot drift forward. ? Do not let your knee roll toward the ceiling. 4. Hold this position for __________ seconds. 5. Slowly return your leg to the starting position. 6. Let your muscles relax completely after each repetition. Repeat __________ times. Complete this exercise  __________ times a day. Exercise L: Straight Leg Raises - Hip Extensors 1. Lie on your abdomen on a firm surface. You can put a pillow under your hips if that is more comfortable. 2. Tense the muscles in your buttocks and lift your left / right leg about 4-6 inches (10-15 cm). Keep your knee straight as you lift your leg. 3. Hold this position for __________ seconds. 4. Slowly lower your leg to the starting position. 5. Let your leg relax completely after each repetition. Repeat __________ times. Complete this exercise __________ times a day. This information is not intended to replace advice given to you by your health care provider. Make sure you discuss any questions you have with your health care provider. Document Released: 12/24/2004 Document Revised: 11/04/2015 Document Reviewed: 12/16/2014 Elsevier Interactive Patient Education  2018 [employer].     [place] K. Promise Bushong M.D.

## 2017-06-09 ENCOUNTER — Encounter: Payer: Self-pay | Admitting: Internal Medicine

## 2017-06-09 ENCOUNTER — Ambulatory Visit (INDEPENDENT_AMBULATORY_CARE_PROVIDER_SITE_OTHER): Payer: BLUE CROSS/BLUE SHIELD | Admitting: Internal Medicine

## 2017-06-09 VITALS — BP 128/68 | HR 79 | Temp 98.1°F | Ht 69.0 in | Wt 179.1 lb

## 2017-06-09 DIAGNOSIS — L709 Acne, unspecified: Secondary | ICD-10-CM | POA: Diagnosis not present

## 2017-06-09 DIAGNOSIS — M25569 Pain in unspecified knee: Secondary | ICD-10-CM | POA: Diagnosis not present

## 2017-06-09 DIAGNOSIS — Z0000 Encounter for general adult medical examination without abnormal findings: Secondary | ICD-10-CM | POA: Diagnosis not present

## 2017-06-09 DIAGNOSIS — Z79899 Other long term (current) drug therapy: Secondary | ICD-10-CM

## 2017-06-09 DIAGNOSIS — F840 Autistic disorder: Secondary | ICD-10-CM | POA: Diagnosis not present

## 2017-06-09 MED ORDER — ADAPALENE 0.1 % EX CREA: TOPICAL_CREAM | CUTANEOUS | 2 refills | Status: DC

## 2017-06-09 NOTE — Patient Instructions (Addendum)
Continue lifestyle intervention healthy eating and exercise .  Knee looks ok    Exercises .     If  Progressive  Then can see  sports medicine  Can do   Blood work next year     Health Maintenance, Male A healthy lifestyle and preventive care is important for your health and wellness. Ask your health care provider about what schedule of regular examinations is right for you. What should I know about weight and diet? Eat a Healthy Diet  Eat plenty of vegetables, fruits, whole grains, low-fat dairy products, and lean protein.  Do not eat a lot of foods high in solid fats, added sugars, or salt.  Maintain a Healthy Weight Regular exercise can help you achieve or maintain a healthy weight. You should:  Do at least 150 minutes of exercise each week. The exercise should increase your heart rate and make you sweat (moderate-intensity exercise).  Do strength-training exercises at least twice a week.  Watch Your Levels of Cholesterol and Blood Lipids  Have your blood tested for lipids and cholesterol every 5 years starting at 28 years of age. If you are at high risk for heart disease, you should start having your blood tested when you are 28 years old. You may need to have your cholesterol levels checked more often if: ? Your lipid or cholesterol levels are high. ? You are older than 28 years of age. ? You are at high risk for heart disease.  What should I know about cancer screening? Many types of cancers can be detected early and may often be prevented. Lung Cancer  You should be screened every year for lung cancer if: ? You are a current smoker who has smoked for at least 30 years. ? You are a former smoker who has quit within the past 15 years.  Talk to your health care provider about your screening options, when you should start screening, and how often you should be screened.  Colorectal Cancer  Routine colorectal cancer screening usually begins at 28 years of age and  should be repeated every 5-10 years until you are 28 years old. You may need to be screened more often if early forms of precancerous polyps or small growths are found. Your health care provider may recommend screening at an earlier age if you have risk factors for colon cancer.  Your health care provider may recommend using home test kits to check for hidden blood in the stool.  A small camera at the end of a tube can be used to examine your colon (sigmoidoscopy or colonoscopy). This checks for the earliest forms of colorectal cancer.  Prostate and Testicular Cancer  Depending on your age and overall health, your health care provider may do certain tests to screen for prostate and testicular cancer.  Talk to your health care provider about any symptoms or concerns you have about testicular or prostate cancer.  Skin Cancer  Check your skin from head to toe regularly.  Tell your health care provider about any new moles or changes in moles, especially if: ? There is a change in a mole's size, shape, or color. ? You have a mole that is larger than a pencil eraser.  Always use sunscreen. Apply sunscreen liberally and repeat throughout the day.  Protect yourself by wearing long sleeves, pants, a wide-brimmed hat, and sunglasses when outside.  What should I know about heart disease, diabetes, and high blood pressure?  If you are 18-39 years  of age, have your blood pressure checked every 3-5 years. If you are 53 years of age or older, have your blood pressure checked every year. You should have your blood pressure measured twice-once when you are at a hospital or clinic, and once when you are not at a hospital or clinic. Record the average of the two measurements. To check your blood pressure when you are not at a hospital or clinic, you can use: ? An automated blood pressure machine at a pharmacy. ? A home blood pressure monitor.  Talk to your health care provider about your target blood  pressure.  If you are between 89-4 years old, ask your health care provider if you should take aspirin to prevent heart disease.  Have regular diabetes screenings by checking your fasting blood sugar level. ? If you are at a normal weight and have a low risk for diabetes, have this test once every three years after the age of 33. ? If you are overweight and have a high risk for diabetes, consider being tested at a younger age or more often.  A one-time screening for abdominal aortic aneurysm (AAA) by ultrasound is recommended for men aged 65-75 years who are current or former smokers. What should I know about preventing infection? Hepatitis B If you have a higher risk for hepatitis B, you should be screened for this virus. Talk with your health care provider to find out if you are at risk for hepatitis B infection. Hepatitis C Blood testing is recommended for:  Everyone born from [birth year] through 1966.  Anyone with known risk factors for hepatitis C.  Sexually Transmitted Diseases (STDs)  You should be screened each year for STDs including gonorrhea and chlamydia if: ? You are sexually active and are younger than 28 years of age. ? You are older than 28 years of age and your health care provider tells you that you are at risk for this type of infection. ? Your sexual activity has changed since you were last screened and you are at an increased risk for chlamydia or gonorrhea. Ask your health care provider if you are at risk.  Talk with your health care provider about whether you are at high risk of being infected with HIV. Your health care provider may recommend a prescription medicine to help prevent HIV infection.  What else can I do?  Schedule regular health, dental, and eye exams.  Stay current with your vaccines (immunizations).  Do not use any tobacco products, such as cigarettes, chewing tobacco, and e-cigarettes. If you need help quitting, ask your health care  provider.  Limit alcohol intake to no more than 2 drinks per day. One drink equals 12 ounces of beer, 5 ounces of wine, or 1 ounces of hard liquor.  Do not use street drugs.  Do not share needles.  Ask your health care provider for help if you need support or information about quitting drugs.  Tell your health care provider if you often feel depressed.  Tell your health care provider if you have ever been abused or do not feel safe at home. This information is not intended to replace advice given to you by your health care provider. Make sure you discuss any questions you have with your health care provider. Document Released: 08/08/2007 Document Revised: 10/09/2015 Document Reviewed: 11/13/2014 Elsevier Interactive Patient Education  2018 [employer].    Knee Exercises Ask your health care provider which exercises are safe for you. Do exercises exactly as  told by your health care provider and adjust them as directed. It is normal to feel mild stretching, pulling, tightness, or discomfort as you do these exercises, but you should stop right away if you feel sudden pain or your pain gets worse.Do not begin these exercises until told by your health care provider. STRETCHING AND RANGE OF MOTION EXERCISES These exercises warm up your muscles and joints and improve the movement and flexibility of your knee. These exercises also help to relieve pain, numbness, and tingling. Exercise A: Knee Extension, Prone 1. Lie on your abdomen on a bed. 2. Place your left / right knee just beyond the edge of the surface so your knee is not on the bed. You can put a towel under your left / right thigh just above your knee for comfort. 3. Relax your leg muscles and allow gravity to straighten your knee. You should feel a stretch behind your left / right knee. 4. Hold this position for __________ seconds. 5. Scoot up so your knee is supported between repetitions. Repeat __________ times. Complete this  stretch __________ times a day. Exercise B: Knee Flexion, Active  1. Lie on your back with both knees straight. If this causes back discomfort, bend your left / right knee so your foot is flat on the floor. 2. Slowly slide your left / right heel back toward your buttocks until you feel a gentle stretch in the front of your knee or thigh. 3. Hold this position for __________ seconds. 4. Slowly slide your left / right heel back to the starting position. Repeat __________ times. Complete this exercise __________ times a day. Exercise C: Quadriceps, Prone  1. Lie on your abdomen on a firm surface, such as a bed or padded floor. 2. Bend your left / right knee and hold your ankle. If you cannot reach your ankle or pant leg, loop a belt around your foot and grab the belt instead. 3. Gently pull your heel toward your buttocks. Your knee should not slide out to the side. You should feel a stretch in the front of your thigh and knee. 4. Hold this position for __________ seconds. Repeat __________ times. Complete this stretch __________ times a day. Exercise D: Hamstring, Supine 1. Lie on your back. 2. Loop a belt or towel over the ball of your left / right foot. The ball of your foot is on the walking surface, right under your toes. 3. Straighten your left / right knee and slowly pull on the belt to raise your leg until you feel a gentle stretch behind your knee. ? Do not let your left / right knee bend while you do this. ? Keep your other leg flat on the floor. 4. Hold this position for __________ seconds. Repeat __________ times. Complete this stretch __________ times a day. STRENGTHENING EXERCISES These exercises build strength and endurance in your knee. Endurance is the ability to use your muscles for a long time, even after they get tired. Exercise E: Quadriceps, Isometric  1. Lie on your back with your left / right leg extended and your other knee bent. Put a rolled towel or small pillow  under your knee if told by your health care provider. 2. Slowly tense the muscles in the front of your left / right thigh. You should see your kneecap slide up toward your hip or see increased dimpling just above the knee. This motion will push the back of the knee toward the floor. 3. For __________ seconds, keep  the muscle as tight as you can without increasing your pain. 4. Relax the muscles slowly and completely. Repeat __________ times. Complete this exercise __________ times a day. Exercise F: Straight Leg Raises - Quadriceps 1. Lie on your back with your left / right leg extended and your other knee bent. 2. Tense the muscles in the front of your left / right thigh. You should see your kneecap slide up or see increased dimpling just above the knee. Your thigh may even shake a bit. 3. Keep these muscles tight as you raise your leg 4-6 inches (10-15 cm) off the floor. Do not let your knee bend. 4. Hold this position for __________ seconds. 5. Keep these muscles tense as you lower your leg. 6. Relax your muscles slowly and completely after each repetition. Repeat __________ times. Complete this exercise __________ times a day. Exercise G: Hamstring, Isometric 1. Lie on your back on a firm surface. 2. Bend your left / right knee approximately __________ degrees. 3. Dig your left / right heel into the surface as if you are trying to pull it toward your buttocks. Tighten the muscles in the back of your thighs to dig as hard as you can without increasing any pain. 4. Hold this position for __________ seconds. 5. Release the tension gradually and allow your muscles to relax completely for __________ seconds after each repetition. Repeat __________ times. Complete this exercise __________ times a day. Exercise H: Hamstring Curls  If told by your health care provider, do this exercise while wearing ankle weights. Begin with __________ weights. Then increase the weight by 1 lb (0.5 kg) increments.  Do not wear ankle weights that are more than __________. 1. Lie on your abdomen with your legs straight. 2. Bend your left / right knee as far as you can without feeling pain. Keep your hips flat against the floor. 3. Hold this position for __________ seconds. 4. Slowly lower your leg to the starting position.  Repeat __________ times. Complete this exercise __________ times a day. Exercise I: Squats (Quadriceps) 1. Stand in front of a table, with your feet and knees pointing straight ahead. You may rest your hands on the table for balance but not for support. 2. Slowly bend your knees and lower your hips like you are going to sit in a chair. ? Keep your weight over your heels, not over your toes. ? Keep your lower legs upright so they are parallel with the table legs. ? Do not let your hips go lower than your knees. ? Do not bend lower than told by your health care provider. ? If your knee pain increases, do not bend as low. 3. Hold the squat position for __________ seconds. 4. Slowly push with your legs to return to standing. Do not use your hands to pull yourself to standing. Repeat __________ times. Complete this exercise __________ times a day. Exercise J: Wall Slides (Quadriceps)  1. Lean your back against a smooth wall or door while you walk your feet out 18-24 inches (46-61 cm) from it. 2. Place your feet hip-width apart. 3. Slowly slide down the wall or door until your knees bend __________ degrees. Keep your knees over your heels, not over your toes. Keep your knees in line with your hips. 4. Hold for __________ seconds. Repeat __________ times. Complete this exercise __________ times a day. Exercise K: Straight Leg Raises - Hip Abductors 1. Lie on your side with your left / right leg in the top position.  Lie so your head, shoulder, knee, and hip line up. You may bend your bottom knee to help you keep your balance. 2. Roll your hips slightly forward so your hips are stacked  directly over each other and your left / right knee is facing forward. 3. Leading with your heel, lift your top leg 4-6 inches (10-15 cm). You should feel the muscles in your outer hip lifting. ? Do not let your foot drift forward. ? Do not let your knee roll toward the ceiling. 4. Hold this position for __________ seconds. 5. Slowly return your leg to the starting position. 6. Let your muscles relax completely after each repetition. Repeat __________ times. Complete this exercise __________ times a day. Exercise L: Straight Leg Raises - Hip Extensors 1. Lie on your abdomen on a firm surface. You can put a pillow under your hips if that is more comfortable. 2. Tense the muscles in your buttocks and lift your left / right leg about 4-6 inches (10-15 cm). Keep your knee straight as you lift your leg. 3. Hold this position for __________ seconds. 4. Slowly lower your leg to the starting position. 5. Let your leg relax completely after each repetition. Repeat __________ times. Complete this exercise __________ times a day. This information is not intended to replace advice given to you by your health care provider. Make sure you discuss any questions you have with your health care provider. Document Released: 12/24/2004 Document Revised: 11/04/2015 Document Reviewed: 12/16/2014 Elsevier Interactive Patient Education  2018 [employer].

## 2017-06-10 ENCOUNTER — Other Ambulatory Visit: Payer: Self-pay | Admitting: Internal Medicine

## 2017-07-11 ENCOUNTER — Other Ambulatory Visit: Payer: Self-pay | Admitting: Internal Medicine

## 2017-09-09 ENCOUNTER — Other Ambulatory Visit: Payer: Self-pay | Admitting: Internal Medicine

## 2017-10-14 ENCOUNTER — Other Ambulatory Visit: Payer: Self-pay | Admitting: Internal Medicine

## 2017-11-15 ENCOUNTER — Other Ambulatory Visit: Payer: Self-pay | Admitting: Internal Medicine

## 2017-12-11 ENCOUNTER — Other Ambulatory Visit: Payer: Self-pay | Admitting: Internal Medicine

## 2017-12-27 ENCOUNTER — Telehealth: Payer: Self-pay | Admitting: *Deleted

## 2017-12-27 DIAGNOSIS — R4586 Emotional lability: Secondary | ICD-10-CM

## 2017-12-27 DIAGNOSIS — F4322 Adjustment disorder with anxiety: Secondary | ICD-10-CM

## 2017-12-27 NOTE — Telephone Encounter (Signed)
Prior auth for Escitalopram 20mg  sent to [email or web address] [id number].

## 2017-12-28 NOTE — Telephone Encounter (Signed)
[employer] needs additional questions regarding Escitalopram 20mg   Call back @ [phone] reference number [id number]

## 2017-12-31 NOTE — Telephone Encounter (Signed)
Fax received from [facility] stating the request was denied and this was given to Dr Rosezella [place] asst.

## 2018-01-04 DIAGNOSIS — R4586 Emotional lability: Secondary | ICD-10-CM | POA: Insufficient documentation

## 2018-01-04 DIAGNOSIS — F4322 Adjustment disorder with anxiety: Secondary | ICD-10-CM | POA: Insufficient documentation

## 2018-01-04 NOTE — Telephone Encounter (Signed)
[phone] Per representative the diagnosis code given F84.0 [employer] Spectrum is not an accepted dx code for this medication.  We need to either change the diagnosis code or contact the Appeals department directly for more clinical information to support the dx code given.  Appeal must be filed by 02/28/2018 Appeals Department [phone]  [facility]  Member ID: [id number]  Will send to Dr Fabian SharpPanosh to see if there is another dx code that can be used.

## 2018-01-04 NOTE — Telephone Encounter (Signed)
This med was used initially for mood disturbance irritability and anxiety .  And has done well on this medication   It would be  A heath risk to stop this at this time since he is now  Seems to be doing well .

## 2018-01-07 NOTE — Telephone Encounter (Signed)
New Dx Code: Mood Disorder R45.86, Adjustment Disorder with Anxiety F43.22  Appeal form filled out and [facility] [phone] Unable to do via telephone [facility]   Will hold for coverage determination.

## 2018-01-11 ENCOUNTER — Other Ambulatory Visit: Payer: Self-pay

## 2018-01-11 ENCOUNTER — Other Ambulatory Visit: Payer: Self-pay | Admitting: Internal Medicine

## 2018-01-11 MED ORDER — ESCITALOPRAM OXALATE 20 MG PO TABS: 40.0000 mg | ORAL_TABLET | Freq: Every morning | ORAL | 1 refills | Status: DC

## 2018-01-11 NOTE — Telephone Encounter (Signed)
Pt mother called in and stated that they now have [employer], father changed jobs and she thinks pt also has [employer].    [employer]/ it was [employer] father is in transition of job  [employer] Medicare  Medicaid    Pt has 1 more days worth and he will be out.  What are the options?   Best number [phone] Cell [phone]

## 2018-01-11 NOTE — Telephone Encounter (Signed)
Pt mom also wanting me to see if Dr.panoh has a cheaper alternative in the mean time.

## 2018-01-11 NOTE — Telephone Encounter (Signed)
Per Dr.Panosh, pt mom was  Informed that she could take a hard copy of Rx and use [facility] to take it to the cheapest pharmacy. Pt mom stated that the Rx actually went through w/o any problems. Pt stated she will use [employer] next time if the issue occurs again. No further action needed!

## 2018-01-11 NOTE — Telephone Encounter (Addendum)
We have faxed back the appeals information to try and get this medication covered. We are waiting on the final determination. The patient may have to pay out of pocket for a short term supply in the meantime as there is nothing more that we can do to move the appeal along at this point.

## 2018-01-11 NOTE — Telephone Encounter (Signed)
Spoke to pt and she stated that the pharmacy said it may be covered the Rx may need refills. Rx has been resent. If Rx does not go through, pt was advise to call [employer] to see what may be covered.

## 2018-01-17 NOTE — Telephone Encounter (Signed)
[facility] from 01/08/2018-02/23/2019  Mother aware. Nothing further needed.

## 2018-05-04 ENCOUNTER — Other Ambulatory Visit: Payer: Self-pay | Admitting: Internal Medicine

## 2018-06-10 NOTE — Progress Notes (Signed)
Virtual Visit via Video Note  I connected with@ on 06/13/18 at  9:15 AM EDT by a video enabled telemedicine application and verified that I am speaking with the correct person using two identifiers. Location patient: home Location provider:work office Persons participating in the virtual visit: patient, provider  [place] national recommendations  regarding COVID 19 pandemic   video visit is advised over in office visit for this patient.  Discussed the limitations of evaluation and management by telemedicine and  availability of in person appointments. The patient expressed understanding and agreed to proceed.   HPI: Brian Mcgrath Presents for video visit for pre yearly check  hewe with mom   ASD and MOOD:  Current regimen seems to help and quite stable    On lexapro 40 tecex 3/4 1 mg bid  And valcyclovir ( was on this for high titers  And not hsv but seems to be doing well )   Acne : needs refill Differin and seems to help    Furloughed from work Programmer, [employer] and [employer].    exrcising every day   Walking and eating healthy per mom  Sleep 11 - 7 am   ROS: See pertinent positives and negatives per HPI. No cv  New mood sx adapting fine  Neg tad  Past Medical History:  Diagnosis Date  . Acne   . Allergic rhinitis   . Autism   . FRACTURE, ARM, RIGHT 07/18/2008   Qualifier: History of  By: Fabian Sharp MD, Neta Mends   . Varicella     Past Surgical History:  Procedure Laterality Date  . ADENOIDECTOMY    . FRACTURE SURGERY     right arm   . recircumcised    . rock removal from ear  1999  . TONSILLECTOMY  2003    Family History  Problem Relation Age of Onset  . Diverticulosis Mother   . Depression Father     Social History   Tobacco Use  . Smoking status: Never Smoker  . Smokeless tobacco: Never Used  Substance Use Topics  . Alcohol use: No  . Drug use: Not on file   Social History   Socioeconomic History  . Marital status: Single    Spouse name: Not on  file  . Number of children: Not on file  . Years of education: Not on file  . Highest education level: Not on file  Occupational History  . Not on file  Social Needs  . Financial resource strain: Not on file  . Food insecurity:    Worry: Not on file    Inability: Not on file  . Transportation needs:    Medical: Not on file    Non-medical: Not on file  Tobacco Use  . Smoking status: Never Smoker  . Smokeless tobacco: Never Used  Substance and Sexual Activity  . Alcohol use: No  . Drug use: Not on file  . Sexual activity: Not on file  Lifestyle  . Physical activity:    Days per week: Not on file    Minutes per session: Not on file  . Stress: Not on file  Relationships  . Social connections:    Talks on phone: Not on file    Gets together: Not on file    Attends religious service: Not on file    Active member of club or organization: Not on file    Attends meetings of clubs or organizations: Not on file    Relationship status: Not on  file  Other Topics Concern  . Not on file  Social History Narrative   Lives at home with parents is working 3 days a week  Plus other jobs  were [employer]   Church       Older brother      Health Maintenance  Topic Date Due  . HIV Screening  08/17/2004  . INFLUENZA VACCINE  09/24/2018  . TETANUS/TDAP  10/25/2019      Current Outpatient Medications:  .  adapalene (DIFFERIN) 0.1 % cream, APPLY TOPICALLY AT BEDTIME. PEA SIZED AMOUNT TO AFFTECTED AREA AS DIRECTED., Disp: 45 g, Rfl: 3 .  escitalopram (LEXAPRO) 20 MG tablet, Take 2 tablets (40 mg total) by mouth every morning., Disp: 180 tablet, Rfl: 1 .  guanFACINE (TENEX) 1 MG tablet, Take 3/4 tab by mouth bid, Disp: 135 tablet, Rfl: 2 .  valACYclovir (VALTREX) 1000 MG tablet, TAKE 1 TABLET(1000 MG) BY MOUTH DAILY, Disp: 90 tablet, Rfl: 0  EXAM: BP Readings from Last 3 Encounters:  06/09/17 128/68  05/06/16 104/78  05/01/15 100/66    VITALS per patient if  applicable: weight 170   Per report  GENERAL: alert, oriented, appears well and in no acute distress  HEENT: atraumatic, conjunttiva clear, no obvious abnormalities on inspection of external nose and ears  NECK: normal movements of the head and neck  LUNGS: on inspection no signs of respiratory distress, breathing rate appears normal, no obvious gross SOB, gasping or wheezing  CV: no obvious cyanosis  MS: moves all visible extremities without noticeable abnormality  PSYCH/NEURO: pleasant and cooperative,  speech  Flat but nl  For hom   And interaction stable   Looks well and thought processing grossly intact Lab Results  Component Value Date   WBC 4.4 04/29/2016   HGB 14.8 04/29/2016   HCT 43.5 04/29/2016   PLT 240.0 04/29/2016   GLUCOSE 82 04/29/2016   CHOL 143 04/29/2016   TRIG 80.0 04/29/2016   HDL 38.20 (L) 04/29/2016   LDLDIRECT 102.0 05/23/2014   LDLCALC 89 04/29/2016   ALT 15 04/29/2016   AST 16 04/29/2016   NA 141 04/29/2016   K 4.2 04/29/2016   CL 102 04/29/2016   CREATININE 0.74 04/29/2016   BUN 13 04/29/2016   CO2 33 (H) 04/29/2016   TSH 2.53 04/29/2016   Wt Readings from Last 3 Encounters:  06/09/17 179 lb 1.6 oz (81.2 kg)  05/06/16 185 lb 9.6 oz (84.2 kg)  05/01/15 190 lb 12.8 oz (86.5 kg)  reported    BP Readings from Last 3 Encounters:  06/09/17 128/68  05/06/16 104/78  05/01/15 100/66    ASSESSMENT AND PLAN:  Discussed the following assessment and plan:  Medication management - Plan: Basic metabolic panel, CBC with Differential/Platelet, Hepatic function panel, Lipid panel, TSH  Autistic disorder - Plan: Basic metabolic panel, CBC with Differential/Platelet, Hepatic function panel, Lipid panel, TSH  Autism spectrum disorder - Plan: Basic metabolic panel, CBC with Differential/Platelet, Hepatic function panel, Lipid panel, TSH  Mood disturbance - Plan: Basic metabolic panel, CBC with Differential/Platelet, Hepatic function panel, Lipid panel,  TSH  Low HDL (under 40) - Plan: Basic metabolic panel, CBC with Differential/Platelet, Hepatic function panel, Lipid panel, TSH  ACNE VULGARIS - Plan: Basic metabolic panel, CBC with Differential/Platelet, Hepatic function panel, Lipid panel, TSH  Counseled.  Future blood monitoring   He has intensified  Healthy [place] and eating and suspec  Lipids will be better Benefit more than risk of medications  to continue. Seems to be tolerating  Furlough  At this time   Refill med plan lab  In summer and cpx in 4-5 mos  Expectant management and discussion of plan and treatment with patient with opportunity to ask questions and all were answered. The patient agreed with the plan and demonstrated an understanding of the instructions.   The patient was advised to call back or seek an in-person evaluation if worsening  or having concerns .     Berniece Andreas, MD

## 2018-06-13 ENCOUNTER — Other Ambulatory Visit: Payer: Self-pay

## 2018-06-13 ENCOUNTER — Encounter: Payer: Self-pay | Admitting: Internal Medicine

## 2018-06-13 ENCOUNTER — Ambulatory Visit (INDEPENDENT_AMBULATORY_CARE_PROVIDER_SITE_OTHER): Payer: Medicare Other | Admitting: Internal Medicine

## 2018-06-13 DIAGNOSIS — Z79899 Other long term (current) drug therapy: Secondary | ICD-10-CM | POA: Diagnosis not present

## 2018-06-13 DIAGNOSIS — F840 Autistic disorder: Secondary | ICD-10-CM | POA: Diagnosis not present

## 2018-06-13 DIAGNOSIS — E786 Lipoprotein deficiency: Secondary | ICD-10-CM

## 2018-06-13 DIAGNOSIS — L708 Other acne: Secondary | ICD-10-CM

## 2018-06-13 DIAGNOSIS — R4586 Emotional lability: Secondary | ICD-10-CM | POA: Diagnosis not present

## 2018-06-13 MED ORDER — ESCITALOPRAM OXALATE 20 MG PO TABS: 40.0000 mg | ORAL_TABLET | Freq: Every morning | ORAL | 1 refills | Status: DC

## 2018-06-13 MED ORDER — GUANFACINE HCL 1 MG PO TABS: ORAL_TABLET | ORAL | 2 refills | Status: DC

## 2018-06-13 MED ORDER — ADAPALENE 0.1 % EX CREA: TOPICAL_CREAM | CUTANEOUS | 3 refills | Status: AC

## 2018-07-27 ENCOUNTER — Other Ambulatory Visit (INDEPENDENT_AMBULATORY_CARE_PROVIDER_SITE_OTHER): Payer: Medicare Other

## 2018-07-27 ENCOUNTER — Other Ambulatory Visit: Payer: Self-pay

## 2018-07-27 DIAGNOSIS — Z79899 Other long term (current) drug therapy: Secondary | ICD-10-CM | POA: Diagnosis not present

## 2018-07-27 DIAGNOSIS — R4586 Emotional lability: Secondary | ICD-10-CM | POA: Diagnosis not present

## 2018-07-27 DIAGNOSIS — E786 Lipoprotein deficiency: Secondary | ICD-10-CM | POA: Diagnosis not present

## 2018-07-27 DIAGNOSIS — L708 Other acne: Secondary | ICD-10-CM

## 2018-07-27 DIAGNOSIS — F840 Autistic disorder: Secondary | ICD-10-CM | POA: Diagnosis not present

## 2018-07-27 LAB — CBC WITH DIFFERENTIAL/PLATELET
Basophils Absolute: 0 10*3/uL (ref 0.0–0.1)
Basophils Relative: 0.3 % (ref 0.0–3.0)
Eosinophils Absolute: 0.1 10*3/uL (ref 0.0–0.7)
Eosinophils Relative: 1.7 % (ref 0.0–5.0)
HCT: 42.1 % (ref 39.0–52.0)
Hemoglobin: 14.8 g/dL (ref 13.0–17.0)
Lymphocytes Relative: 38.8 % (ref 12.0–46.0)
Lymphs Abs: 1.2 10*3/uL (ref 0.7–4.0)
MCHC: 35.1 g/dL (ref 30.0–36.0)
MCV: 91 fl (ref 78.0–100.0)
Monocytes Absolute: 0.4 10*3/uL (ref 0.1–1.0)
Monocytes Relative: 12.7 % — ABNORMAL HIGH (ref 3.0–12.0)
Neutro Abs: 1.4 10*3/uL (ref 1.4–7.7)
Neutrophils Relative %: 46.5 % (ref 43.0–77.0)
Platelets: 169 10*3/uL (ref 150.0–400.0)
RBC: 4.62 Mil/uL (ref 4.22–5.81)
RDW: 12.8 % (ref 11.5–15.5)
WBC: 3 10*3/uL — ABNORMAL LOW (ref 4.0–10.5)

## 2018-07-27 LAB — BASIC METABOLIC PANEL
BUN: 10 mg/dL (ref 6–23)
CO2: 30 mEq/L (ref 19–32)
Calcium: 9.5 mg/dL (ref 8.4–10.5)
Chloride: 102 mEq/L (ref 96–112)
Creatinine, Ser: 0.77 mg/dL (ref 0.40–1.50)
GFR: 119.49 mL/min (ref >60.00)
Glucose, Bld: 84 mg/dL (ref 70–99)
Potassium: 4.1 mEq/L (ref 3.5–5.1)
Sodium: 140 mEq/L (ref 135–145)

## 2018-07-27 LAB — HEPATIC FUNCTION PANEL
ALT: 9 U/L (ref 0–53)
AST: 13 U/L (ref 0–37)
Albumin: 4.6 g/dL (ref 3.5–5.2)
Alkaline Phosphatase: 42 U/L (ref 39–117)
Bilirubin, Direct: 0.2 mg/dL (ref 0.0–0.3)
Total Bilirubin: 0.8 mg/dL (ref 0.2–1.2)
Total Protein: 6.7 g/dL (ref 6.0–8.3)

## 2018-07-27 LAB — LIPID PANEL
Cholesterol: 147 mg/dL (ref 0–200)
HDL: 45.5 mg/dL (ref >39.00)
LDL Cholesterol: 93 mg/dL (ref 0–99)
NonHDL: 101
Total CHOL/HDL Ratio: 3
Triglycerides: 42 mg/dL (ref 0.0–149.0)
VLDL: 8.4 mg/dL (ref 0.0–40.0)

## 2018-07-27 LAB — TSH: TSH: 1.58 u[IU]/mL (ref 0.35–4.50)

## 2018-08-08 ENCOUNTER — Ambulatory Visit (INDEPENDENT_AMBULATORY_CARE_PROVIDER_SITE_OTHER): Payer: Medicare Other | Admitting: Internal Medicine

## 2018-08-08 ENCOUNTER — Ambulatory Visit: Payer: Medicare Other | Admitting: Internal Medicine

## 2018-08-08 ENCOUNTER — Encounter: Payer: Self-pay | Admitting: Internal Medicine

## 2018-08-08 ENCOUNTER — Other Ambulatory Visit: Payer: Self-pay

## 2018-08-08 DIAGNOSIS — R1013 Epigastric pain: Secondary | ICD-10-CM | POA: Diagnosis not present

## 2018-08-08 DIAGNOSIS — K219 Gastro-esophageal reflux disease without esophagitis: Secondary | ICD-10-CM | POA: Diagnosis not present

## 2018-08-08 DIAGNOSIS — R634 Abnormal weight loss: Secondary | ICD-10-CM | POA: Diagnosis not present

## 2018-08-08 MED ORDER — OMEPRAZOLE 20 MG PO CPDR: 20.0000 mg | DELAYED_RELEASE_CAPSULE | Freq: Every day | ORAL | 2 refills | Status: DC

## 2018-08-08 NOTE — Progress Notes (Signed)
Virtual Visit via Video Note  I connected with@ on 08/08/18 at  2:30 PM EDT by a video enabled telemedicine application and verified that I am speaking with the correct person using two identifiers. Location patient: home care with mom  Location provider:work office Persons participating in the virtual visit: patient, provider  [place] national recommendations  regarding COVID 19 pandemic   video visit is advised over in office visit for this patient.  Patient aware  of the limitations of evaluation and management by telemedicine and  availability of in person appointments. and agreed to proceed.   HPI: Brian Mcgrath presents for video visit co of problem since easter after eating a  Whole solid chocolate bunny .   CO of post prandial abd pain ( lower?)  And  Also buring in chest and mouth   without true vomiting  And no change in bowel habits   Not on  Asa nsaids new meds  . No one else with same .  No vomiting fever    Weight is 162  Range this am . Mom got him otc  omeprazole ? Bid  For 2 weeks and this helped    But when off ( for the last 2-3 weeks sx back to previous.   They have implemented  Many  Diet changes   When mom cooks at home seems to have less problem than eatin from outside food such as a taco etc.  ROS: See pertinent positives and negatives per HPI. No fever  resp sx   Some weight loss     Past Medical History:  Diagnosis Date  . Acne   . Allergic rhinitis   . Autism   . FRACTURE, ARM, RIGHT 07/18/2008   Qualifier: History of  By: Regis Bill MD, Standley Brooking   . Varicella     Past Surgical History:  Procedure Laterality Date  . ADENOIDECTOMY    . FRACTURE SURGERY     right arm   . recircumcised    . rock removal from ear  1999  . TONSILLECTOMY  2003    Family History  Problem Relation Age of Onset  . Diverticulosis Mother   . Depression Father     Social History   Tobacco Use  . Smoking status: Never Smoker  . Smokeless tobacco: Never Used  Substance Use  Topics  . Alcohol use: No  . Drug use: Not on file      Current Outpatient Medications:  .  adapalene (DIFFERIN) 0.1 % cream, APPLY TOPICALLY AT BEDTIME. PEA SIZED AMOUNT TO AFFTECTED AREA AS DIRECTED., Disp: 45 g, Rfl: 3 .  escitalopram (LEXAPRO) 20 MG tablet, Take 2 tablets (40 mg total) by mouth every morning., Disp: 180 tablet, Rfl: 1 .  guanFACINE (TENEX) 1 MG tablet, Take 3/4 tab by mouth bid, Disp: 135 tablet, Rfl: 2 .  omeprazole (PRILOSEC) 20 MG capsule, Take 1 capsule (20 mg total) by mouth daily. Or as directed, Disp: 30 capsule, Rfl: 2 .  valACYclovir (VALTREX) 1000 MG tablet, TAKE 1 TABLET(1000 MG) BY MOUTH DAILY, Disp: 90 tablet, Rfl: 0  EXAM: BP Readings from Last 3 Encounters:  06/09/17 128/68  05/06/16 104/78  05/01/15 100/66    VITALS per patient if applicable: 387 by report   Wt Readings from Last 3 Encounters:  06/09/17 179 lb 1.6 oz (81.2 kg)  05/06/16 185 lb 9.6 oz (84.2 kg)  05/01/15 190 lb 12.8 oz (86.5 kg)     GENERAL: alert, oriented, appears  well and in no acute distress  HEENT: atraumatic, conjunttiva clear, no obvious abnormalities on inspection of external nose and ears  NECK: normal movements of the head and neck  LUNGS: on inspection no signs of respiratory distress, breathing rate appears normal, no obvious gross SOB, gasping or wheezing  CV: no obvious cyanosis  PSYCH/NEURO: pleasant and cooperative, no obvious depression or anxiety, speech and thought processing grossly intact Lab Results  Component Value Date   WBC 3.0 (L) 07/27/2018   HGB 14.8 07/27/2018   HCT 42.1 07/27/2018   PLT 169.0 07/27/2018   GLUCOSE 84 07/27/2018   CHOL 147 07/27/2018   TRIG 42.0 07/27/2018   HDL 45.50 07/27/2018   LDLDIRECT 102.0 05/23/2014   LDLCALC 93 07/27/2018   ALT 9 07/27/2018   AST 13 07/27/2018   NA 140 07/27/2018   K 4.1 07/27/2018   CL 102 07/27/2018   CREATININE 0.77 07/27/2018   BUN 10 07/27/2018   CO2 30 07/27/2018   TSH 1.58  07/27/2018    ASSESSMENT AND PLAN:  Discussed the following assessment and plan:    ICD-10-CM   1. Dyspepsia  R10.13    post prandial new onset  2. Gastroesophageal reflux disease, esophagitis presence not specified  K21.9    waterbrash descrobed  3. Weight loss ?  R63.4     Sounds like regurgitations HB with dyspepsia  As opposed to vomiting  colicy pain . With biliary pain   Advise  Continue diet  Changes and add back prilosec qd for a month and then in office visit  No hx of prev gi sx with this .   Lab from this month reviewed and unrevealing and reassuring.  But concern about  weight loss but  Poss related to  Also covid changes  On diet    Close fu advised  If  persistent or progressive consider other evaluation  h pylori etc  Concern about weight although eating much differently  Counseled.   Expectant management and discussion of plan and treatment with opportunity to ask questions and all were answered. The patient agreed with the plan and demonstrated an understanding of the instructions.   Advised to call back or seek an in-person evaluation if worsening  or having  further concerns .In the interim   Berniece AndreasWanda Sokha Craker, MD

## 2018-09-01 ENCOUNTER — Other Ambulatory Visit: Payer: Self-pay | Admitting: Internal Medicine

## 2018-11-23 DIAGNOSIS — Z23 Encounter for immunization: Secondary | ICD-10-CM | POA: Diagnosis not present

## 2018-12-09 ENCOUNTER — Other Ambulatory Visit: Payer: Self-pay | Admitting: Internal Medicine

## 2018-12-15 DIAGNOSIS — Z20828 Contact with and (suspected) exposure to other viral communicable diseases: Secondary | ICD-10-CM | POA: Diagnosis not present

## 2018-12-15 DIAGNOSIS — Z7189 Other specified counseling: Secondary | ICD-10-CM | POA: Diagnosis not present

## 2018-12-19 ENCOUNTER — Other Ambulatory Visit: Payer: Self-pay

## 2018-12-19 DIAGNOSIS — Z20822 Contact with and (suspected) exposure to covid-19: Secondary | ICD-10-CM

## 2018-12-20 LAB — NOVEL CORONAVIRUS, NAA: SARS-CoV-2, NAA: NOT DETECTED

## 2019-03-05 ENCOUNTER — Other Ambulatory Visit: Payer: Self-pay | Admitting: Internal Medicine

## 2019-03-06 ENCOUNTER — Telehealth: Payer: Self-pay | Admitting: Internal Medicine

## 2019-03-06 ENCOUNTER — Other Ambulatory Visit: Payer: Self-pay | Admitting: Internal Medicine

## 2019-03-06 NOTE — Telephone Encounter (Signed)
Please advise 

## 2019-03-06 NOTE — Telephone Encounter (Signed)
Sent in electronically .  

## 2019-03-06 NOTE — Telephone Encounter (Signed)
Copied from CRM [id number]. Topic: General - Other >> Mar 06, 2019  1:52 PM Tamela Oddi wrote: Reason for CRM: Patient's new insurance said that the patient needs an exemption not in order for this to be covered under his insurance.  Please advise and call mother Ralene Muskrat) to discuss at [phone] or call #[phone]

## 2019-03-06 NOTE — Telephone Encounter (Signed)
Not enough information  To answer this ? Please contact mom and define which med we are talking about  And may need to do  A PA

## 2019-03-07 NOTE — Telephone Encounter (Signed)
Pt mother states its the guafacine

## 2019-03-07 NOTE — Telephone Encounter (Signed)
Pt mother is going to send new insurance card through [employer]

## 2019-03-21 ENCOUNTER — Other Ambulatory Visit: Payer: Self-pay | Admitting: Internal Medicine

## 2019-04-18 NOTE — Telephone Encounter (Signed)
Nothing further needed 

## 2019-05-15 ENCOUNTER — Other Ambulatory Visit: Payer: Self-pay | Admitting: Internal Medicine

## 2019-06-01 ENCOUNTER — Telehealth: Payer: Self-pay

## 2019-06-01 NOTE — Telephone Encounter (Signed)
Called patient and spoke to mother and patient and they both requested a physical at the same time and requested soon. I have scheduled them both for Monday, 06/05/19. Patient at 9am and mother at 9:30am. Sending as Lorain Childes

## 2019-06-01 NOTE — Telephone Encounter (Signed)
He is not on  Guanfacine for hypertension so  The alternatives are not at all equivalent.  Advise   Visit in person or video to discuss   ( mom will be involved as he has ASD ) He is due for cpx PV  Anyway so also arrange appt for  cpx

## 2019-06-01 NOTE — Telephone Encounter (Signed)
I received a message from [employer] that has a date of 05/19/2019 on the paperwork and stating that Guanfacine Tablet 1 mg is not covered under this insurance plan.  Alternatives are:  Amlodipine--Tier 1 Atenolol--Tier 1 Bisoprolol--Tier 2 Betaxolol--Tier 3 Bystolic--Tier 3  FYI: There are previous messages from January 2021 that were not answered.   Paperwork placed in red folder for your review and completion. Please advise if you want to change medication or complete Prior Authorization for this medication OR if they need to set up a visit to discuss.

## 2019-06-05 ENCOUNTER — Encounter: Payer: Self-pay | Admitting: Internal Medicine

## 2019-06-05 ENCOUNTER — Ambulatory Visit (INDEPENDENT_AMBULATORY_CARE_PROVIDER_SITE_OTHER): Payer: Medicare Other | Admitting: Internal Medicine

## 2019-06-05 ENCOUNTER — Other Ambulatory Visit: Payer: Self-pay

## 2019-06-05 VITALS — BP 114/72 | HR 79 | Temp 98.0°F | Ht 69.5 in | Wt 179.2 lb

## 2019-06-05 DIAGNOSIS — Z79899 Other long term (current) drug therapy: Secondary | ICD-10-CM

## 2019-06-05 DIAGNOSIS — Z0000 Encounter for general adult medical examination without abnormal findings: Secondary | ICD-10-CM | POA: Diagnosis not present

## 2019-06-05 DIAGNOSIS — F840 Autistic disorder: Secondary | ICD-10-CM

## 2019-06-05 DIAGNOSIS — E786 Lipoprotein deficiency: Secondary | ICD-10-CM | POA: Diagnosis not present

## 2019-06-05 DIAGNOSIS — R4586 Emotional lability: Secondary | ICD-10-CM

## 2019-06-05 LAB — BASIC METABOLIC PANEL
BUN: 13 mg/dL (ref 6–23)
CO2: 32 mEq/L (ref 19–32)
Calcium: 9.6 mg/dL (ref 8.4–10.5)
Chloride: 101 mEq/L (ref 96–112)
Creatinine, Ser: 0.84 mg/dL (ref 0.40–1.50)
GFR: 107.44 mL/min (ref >60.00)
Glucose, Bld: 83 mg/dL (ref 70–99)
Potassium: 4.5 mEq/L (ref 3.5–5.1)
Sodium: 140 mEq/L (ref 135–145)

## 2019-06-05 LAB — CBC WITH DIFFERENTIAL/PLATELET
Basophils Absolute: 0 10*3/uL (ref 0.0–0.1)
Basophils Relative: 0.4 % (ref 0.0–3.0)
Eosinophils Absolute: 0.1 10*3/uL (ref 0.0–0.7)
Eosinophils Relative: 2.5 % (ref 0.0–5.0)
HCT: 45.3 % (ref 39.0–52.0)
Hemoglobin: 15.5 g/dL (ref 13.0–17.0)
Lymphocytes Relative: 38.5 % (ref 12.0–46.0)
Lymphs Abs: 1.5 10*3/uL (ref 0.7–4.0)
MCHC: 34.2 g/dL (ref 30.0–36.0)
MCV: 90.5 fl (ref 78.0–100.0)
Monocytes Absolute: 0.5 10*3/uL (ref 0.1–1.0)
Monocytes Relative: 11.7 % (ref 3.0–12.0)
Neutro Abs: 1.8 10*3/uL (ref 1.4–7.7)
Neutrophils Relative %: 46.9 % (ref 43.0–77.0)
Platelets: 202 10*3/uL (ref 150.0–400.0)
RBC: 5 Mil/uL (ref 4.22–5.81)
RDW: 12.6 % (ref 11.5–15.5)
WBC: 3.9 10*3/uL — ABNORMAL LOW (ref 4.0–10.5)

## 2019-06-05 LAB — LIPID PANEL
Cholesterol: 170 mg/dL (ref 0–200)
HDL: 44.5 mg/dL (ref >39.00)
LDL Cholesterol: 114 mg/dL — ABNORMAL HIGH (ref 0–99)
NonHDL: 125
Total CHOL/HDL Ratio: 4
Triglycerides: 53 mg/dL (ref 0.0–149.0)
VLDL: 10.6 mg/dL (ref 0.0–40.0)

## 2019-06-05 LAB — HEPATIC FUNCTION PANEL
ALT: 11 U/L (ref 0–53)
AST: 15 U/L (ref 0–37)
Albumin: 4.9 g/dL (ref 3.5–5.2)
Alkaline Phosphatase: 59 U/L (ref 39–117)
Bilirubin, Direct: 0.1 mg/dL (ref 0.0–0.3)
Total Bilirubin: 0.4 mg/dL (ref 0.2–1.2)
Total Protein: 7.1 g/dL (ref 6.0–8.3)

## 2019-06-05 LAB — TSH: TSH: 3.58 u[IU]/mL (ref 0.35–4.50)

## 2019-06-05 NOTE — Patient Instructions (Addendum)
Continue healthy exercise and  Eating. Will notify you  of labs when available.  Due for updated TD  Sept 2021  Or thereabouts.  Can make appt any time for injection.     Preventive Care 56-30 Years Old, Male Preventive care refers to lifestyle choices and visits with your health care provider that can promote health and wellness. This includes:  A yearly physical exam. This is also called an annual well check.  Regular dental and eye exams.  Immunizations.  Screening for certain conditions.  Healthy lifestyle choices, such as eating a healthy diet, getting regular exercise, not using drugs or products that contain nicotine and tobacco, and limiting alcohol use. What can I expect for my preventive care visit? Physical exam Your health care provider will check:  Height and weight. These may be used to calculate body mass index (BMI), which is a measurement that tells if you are at a healthy weight.  Heart rate and blood pressure.  Your skin for abnormal spots. Counseling Your health care provider may ask you questions about:  Alcohol, tobacco, and drug use.  Emotional well-being.  Home and relationship well-being.  Sexual activity.  Eating habits.  Work and work Statistician. What immunizations do I need?  Influenza (flu) vaccine  This is recommended every year. Tetanus, diphtheria, and pertussis (Tdap) vaccine  You may need a Td booster every 10 years. Varicella (chickenpox) vaccine  You may need this vaccine if you have not already been vaccinated. Human papillomavirus (HPV) vaccine  If recommended by your health care provider, you may need three doses over 6 months. Measles, mumps, and rubella (MMR) vaccine  You may need at least one dose of MMR. You may also need a second dose. Meningococcal conjugate (MenACWY) vaccine  One dose is recommended if you are 6-64 years old and a Market researcher living in a residence hall, or if you have one of  several medical conditions. You may also need additional booster doses. Pneumococcal conjugate (PCV13) vaccine  You may need this if you have certain conditions and were not previously vaccinated. Pneumococcal polysaccharide (PPSV23) vaccine  You may need one or two doses if you smoke cigarettes or if you have certain conditions. Hepatitis A vaccine  You may need this if you have certain conditions or if you travel or work in places where you may be exposed to hepatitis A. Hepatitis B vaccine  You may need this if you have certain conditions or if you travel or work in places where you may be exposed to hepatitis B. Haemophilus influenzae type b (Hib) vaccine  You may need this if you have certain risk factors. You may receive vaccines as individual doses or as more than one vaccine together in one shot (combination vaccines). Talk with your health care provider about the risks and benefits of combination vaccines. What tests do I need? Blood tests  Lipid and cholesterol levels. These may be checked every 5 years starting at age 75.  Hepatitis C test.  Hepatitis B test. Screening   Diabetes screening. This is done by checking your blood sugar (glucose) after you have not eaten for a while (fasting).  Sexually transmitted disease (STD) testing. Talk with your health care provider about your test results, treatment options, and if necessary, the need for more tests. Follow these instructions at home: Eating and drinking   Eat a diet that includes fresh fruits and vegetables, whole grains, lean protein, and low-fat dairy products.  Take vitamin and  mineral supplements as recommended by your health care provider.  Do not drink alcohol if your health care provider tells you not to drink.  If you drink alcohol: ? Limit how much you have to 0-2 drinks a day. ? Be aware of how much alcohol is in your drink. In the [place] one drink equals one 12 oz bottle of beer (355 mL), one 5 oz  glass of wine (148 mL), or one 1 oz glass of hard liquor (44 mL). Lifestyle  Take daily care of your teeth and gums.  Stay active. Exercise for at least 30 minutes on 5 or more days each week.  Do not use any products that contain nicotine or tobacco, such as cigarettes, e-cigarettes, and chewing tobacco. If you need help quitting, ask your health care provider.  If you are sexually active, practice safe sex. Use a condom or other form of protection to prevent STIs (sexually transmitted infections). What's next?  Go to your health care provider once a year for a well check visit.  Ask your health care provider how often you should have your eyes and teeth checked.  Stay up to date on all vaccines. This information is not intended to replace advice given to you by your health care provider. Make sure you discuss any questions you have with your health care provider. Document Revised: 02/03/2018 Document Reviewed: 02/03/2018 Elsevier Patient Education  2020 [employer].

## 2019-06-05 NOTE — Progress Notes (Signed)
This visit occurred during the SARS-CoV-2 public health emergency.  Safety protocols were in place, including screening questions prior to the visit, additional usage of staff PPE, and extensive cleaning of exam room while observing appropriate contact time as indicated for disinfecting solutions.    Chief Complaint  Patient presents with  . Annual Exam    Doing good  . Medication Management    HPI: Patient  Brian Mcgrath  29 y.o. comes in today for [place] visit  With mom but seen alone reportedly doing ok no change in health He has ASD and on medication Tenex and lexapro  Valtrex  For a while  And mood seems even and  Sleeping ok .  He still not back to work at the [employer] cause of the covid shut down .  Exercises and works on [employer] eating healthy.    Health Maintenance  Topic Date Due  . HIV Screening  Never done  . INFLUENZA VACCINE  09/24/2019  . TETANUS/TDAP  10/25/2019   Health Maintenance Review LIFESTYLE:  Exercise:  y Tobacco/ETS:n Alcohol: n Sugar beverages:n Sleep:ok Drug use: no HH of 3 Work: covid shut down     ROS: a says stomach is ok    Past Medical History:  Diagnosis Date  . Acne   . Allergic rhinitis   . Autism   . FRACTURE, ARM, RIGHT 07/18/2008   Qualifier: History of  By: Regis Bill MD, Standley Brooking   . Varicella     Past Surgical History:  Procedure Laterality Date  . ADENOIDECTOMY    . FRACTURE SURGERY     right arm   . recircumcised    . rock removal from ear  1999  . TONSILLECTOMY  2003    Family History  Problem Relation Age of Onset  . Diverticulosis Mother   . Depression Father     Social History   Socioeconomic History  . Marital status: Single    Spouse name: Not on file  . Number of children: Not on file  . Years of education: Not on file  . Highest education level: Not on file  Occupational History  . Not on file  Tobacco Use  . Smoking status: Never Smoker  . Smokeless tobacco: Never Used   Substance and Sexual Activity  . Alcohol use: No  . Drug use: Never  . Sexual activity: Not on file  Other Topics Concern  . 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 Difficulty of Paying Living Expenses:   Food Insecurity:   . Worried About Charity fundraiser in the Last Year:   . Arboriculturist in the Last Year:   Transportation Needs:   . Film/video editor (Medical):   Marland Kitchen Lack of Transportation (Non-Medical):   Physical Activity:   . Days of Exercise per Week:   . Minutes of Exercise per Session:   Stress:   . Feeling of Stress :   Social Connections:   . Frequency of Communication with Friends and Family:   . Frequency of Social Gatherings with Friends and Family:   . Attends Religious Services:   . Active Member of Clubs or Organizations:   . Attends  Club or Organization Meetings:   Marland Kitchen Marital Status:     Outpatient Medications Prior to Visit  Medication Sig Dispense Refill  . BIOTIN PO Take by mouth. Takes 2 gummies per day    . escitalopram (LEXAPRO) 20 MG tablet TAKE 2 TABLETS(40 MG) BY MOUTH EVERY MORNING 180 tablet 1  . guanFACINE (TENEX) 1 MG tablet TAKE 3/4 TABLET BY MOUTH TWICE DAILY 135 tablet 1  . MULTIPLE VITAMIN PO Take 1 tablet by mouth daily.    . valACYclovir (VALTREX) 1000 MG tablet TAKE 1 TABLET(1000 MG) BY MOUTH DAILY 90 tablet 0  . adapalene (DIFFERIN) 0.1 % cream APPLY TOPICALLY AT BEDTIME. PEA SIZED AMOUNT TO AFFTECTED AREA AS DIRECTED. (Patient not taking: Reported on 06/05/2019) 45 g 3  . omeprazole (PRILOSEC) 20 MG capsule Take 1 capsule (20 mg total) by mouth daily. Or as directed (Patient not taking: Reported on 06/05/2019) 30 capsule 2   No facility-administered medications prior to visit.     EXAM:  BP 114/72   Pulse 79   Temp 98  F (36.7 C) (Oral)   Ht 5' 9.5" (1.765 m)   Wt 179 lb 3.2 oz (81.3 kg)   SpO2 98%   BMI 26.08 kg/m   Body mass index is 26.08 kg/m. Wt Readings from Last 3 Encounters:  06/05/19 179 lb 3.2 oz (81.3 kg)  06/09/17 179 lb 1.6 oz (81.2 kg)  05/06/16 185 lb 9.6 oz (84.2 kg)    Physical Exam: Vital signs reviewed TDH:RCBU is a well-developed well-nourished alert cooperative    who appearsr stated age in no acute distress.  HEENT: normocephalic atraumatic , Eyes: PERRL EOM's full, conjunctiva clear, Nares: paten,t no deformity discharge or tenderness., Ears: no deformity EAC's clear TMs with normal landmarkssome wax left . Mouth: OPmasked NECK: supple without masses, thyromegaly or bruits. CHEST/PULM:  Clear to auscultation and percussion breath sounds equal no wheeze , rales or rhonchi. No chest wall deformities or tenderness. . CV: PMI is nondisplaced, S1 S2 no gallops, murmurs, rubs. Peripheral pulses are full without delay.No JVD .  ABDOMEN: Bowel sounds normal nontender  No guard or rebound, no hepato splenomegal no CVA tenderness.  Extremtities:  No clubbing cyanosis or edema, no acute joint swelling or redness no focal atrophy NEURO:  Pleasant monotone but   Good conversation , cranial nerves 3-12 appear to be intact, no obvious focal weakness,gait within normal limits no abnormal reflexes or asymmetrical SKIN: No acute rashes normal turgor, color, no bruising or petechiae. [place]  PSYCH: Oriented, good eye contact, no obvious depression anxiety, . LN: no cervical axillary inguinal adenopathy  Lab Results  Component Value Date   WBC 3.9 (L) 06/05/2019   HGB 15.5 06/05/2019   HCT 45.3 06/05/2019   PLT 202.0 06/05/2019   GLUCOSE 83 06/05/2019   CHOL 170 06/05/2019   TRIG 53.0 06/05/2019   HDL 44.50 06/05/2019   LDLDIRECT 102.0 05/23/2014   LDLCALC 114 (H) 06/05/2019   ALT 11 06/05/2019   AST 15 06/05/2019   NA 140 06/05/2019   K 4.5 06/05/2019   CL 101 06/05/2019    CREATININE 0.84 06/05/2019   BUN 13 06/05/2019   CO2 32 06/05/2019   TSH 3.58 06/05/2019    BP Readings from Last 3 Encounters:  06/05/19 114/72  06/09/17 128/68  05/06/16 104/78    Fasting lab today   ASSESSMENT AND PLAN:  Discussed the following assessment and plan:    ICD-10-CM   1. Visit for preventive health  examination  X54.00 Basic metabolic panel    CBC with Differential/Platelet    Hepatic function panel    Lipid panel    TSH  2. Medication management  Q67.619 Basic metabolic panel    CBC with Differential/Platelet    Hepatic function panel    Lipid panel    TSH  3. Autism spectrum disorder  J09.3 Basic metabolic panel    CBC with Differential/Platelet    Hepatic function panel    Lipid panel    TSH  4. Low HDL (under 40)  O67.1 Basic metabolic panel    CBC with Differential/Platelet    Hepatic function panel    Lipid panel    TSH  5. Mood disturbance stable at this time   R45.86    family stresses  continue same medications and lsi  And follow  With labs today  tenex is for attention activity and asd dx   And not ht   Try to do PA  He has been on this med for years and at this time seems stable  Return in about 1 year (around 06/04/2020) for depending on results.  Patient Care Team: Burnis Medin, MD as PCP - General Patient Instructions  Continue healthy exercise and  Eating. Will notify you  of labs when available.  Due for updated TD  Sept 2021  Or thereabouts.  Can make appt any time for injection.     Preventive Care 43-12 Years Old, Male Preventive care refers to lifestyle choices and visits with your health care provider that can promote health and wellness. This includes:  A yearly physical exam. This is also called an annual well check.  Regular dental and eye exams.  Immunizations.  Screening for certain conditions.  Healthy lifestyle choices, such as eating a healthy diet, getting regular exercise, not using drugs or products that  contain nicotine and tobacco, and limiting alcohol use. What can I expect for my preventive care visit? Physical exam Your health care provider will check:  Height and weight. These may be used to calculate body mass index (BMI), which is a measurement that tells if you are at a healthy weight.  Heart rate and blood pressure.  Your skin for abnormal spots. Counseling Your health care provider may ask you questions about:  Alcohol, tobacco, and drug use.  Emotional well-being.  Home and relationship well-being.  Sexual activity.  Eating habits.  Work and work Statistician. What immunizations do I need?  Influenza (flu) vaccine  This is recommended every year. Tetanus, diphtheria, and pertussis (Tdap) vaccine  You may need a Td booster every 10 years. Varicella (chickenpox) vaccine  You may need this vaccine if you have not already been vaccinated. Human papillomavirus (HPV) vaccine  If recommended by your health care provider, you may need three doses over 6 months. Measles, mumps, and rubella (MMR) vaccine  You may need at least one dose of MMR. You may also need a second dose. Meningococcal conjugate (MenACWY) vaccine  One dose is recommended if you are 6-59 years old and a Market researcher living in a residence hall, or if you have one of several medical conditions. You may also need additional booster doses. Pneumococcal conjugate (PCV13) vaccine  You may need this if you have certain conditions and were not previously vaccinated. Pneumococcal polysaccharide (PPSV23) vaccine  You may need one or two doses if you smoke cigarettes or if you have certain conditions. Hepatitis A vaccine  You may need this  if you have certain conditions or if you travel or work in places where you may be exposed to hepatitis A. Hepatitis B vaccine  You may need this if you have certain conditions or if you travel or work in places where you may be exposed to hepatitis B.  Haemophilus influenzae type b (Hib) vaccine  You may need this if you have certain risk factors. You may receive vaccines as individual doses or as more than one vaccine together in one shot (combination vaccines). Talk with your health care provider about the risks and benefits of combination vaccines. What tests do I need? Blood tests  Lipid and cholesterol levels. These may be checked every 5 years starting at age 51.  Hepatitis C test.  Hepatitis B test. Screening   Diabetes screening. This is done by checking your blood sugar (glucose) after you have not eaten for a while (fasting).  Sexually transmitted disease (STD) testing. Talk with your health care provider about your test results, treatment options, and if necessary, the need for more tests. Follow these instructions at home: Eating and drinking   Eat a diet that includes fresh fruits and vegetables, whole grains, lean protein, and low-fat dairy products.  Take vitamin and mineral supplements as recommended by your health care provider.  Do not drink alcohol if your health care provider tells you not to drink.  If you drink alcohol: ? Limit how much you have to 0-2 drinks a day. ? Be aware of how much alcohol is in your drink. In the [place] one drink equals one 12 oz bottle of beer (355 mL), one 5 oz glass of wine (148 mL), or one 1 oz glass of hard liquor (44 mL). Lifestyle  Take daily care of your teeth and gums.  Stay active. Exercise for at least 30 minutes on 5 or more days each week.  Do not use any products that contain nicotine or tobacco, such as cigarettes, e-cigarettes, and chewing tobacco. If you need help quitting, ask your health care provider.  If you are sexually active, practice safe sex. Use a condom or other form of protection to prevent STIs (sexually transmitted infections). What's next?  Go to your health care provider once a year for a well check visit.  Ask your health care provider how  often you should have your eyes and teeth checked.  Stay up to date on all vaccines. This information is not intended to replace advice given to you by your health care provider. Make sure you discuss any questions you have with your health care provider. Document Revised: 02/03/2018 Document Reviewed: 02/03/2018 Elsevier Patient Education  2020 [place] Efe Fazzino M.D.

## 2019-06-06 NOTE — Progress Notes (Signed)
Cholesterol  not as good as last  year but still ok   Continue lifestyle intervention healthy eating and exercise .  Other results stable or normal

## 2019-06-19 ENCOUNTER — Telehealth: Payer: Self-pay

## 2019-06-19 NOTE — Telephone Encounter (Signed)
Prior authorization has been started for Guanfacine 1 mg tablet  Key # [id number]

## 2019-06-20 NOTE — Telephone Encounter (Signed)
I received a fax from [facility] that the prior authorization has been denied for Guanfacine Tab 1 mg. Paperwork placed in red folder. Please review.

## 2019-06-21 ENCOUNTER — Other Ambulatory Visit: Payer: Self-pay

## 2019-06-21 MED ORDER — GUANFACINE HCL 1 MG PO TABS: ORAL_TABLET | ORAL | 1 refills | Status: DC

## 2019-06-21 NOTE — Telephone Encounter (Signed)
Called patients mother Teddi and gave her the message from Dr. Fabian Sharp. Teddi stated that she will try to use the Good Rx and if she has any complications then she will reach out to [place]. Teddi verbalized an understanding.

## 2019-06-21 NOTE — Telephone Encounter (Signed)
Inform mom  They continue to reject  authoirization   May want to check out good rx coupons  And we can send rx    To given pharmacy  May not be that expensive   alternatively can try to wean off ( not advised at this time)  Could take 1/2  Tab  Per day ( low dose)

## 2019-08-22 ENCOUNTER — Other Ambulatory Visit: Payer: Self-pay | Admitting: Internal Medicine

## 2019-11-16 ENCOUNTER — Other Ambulatory Visit: Payer: Self-pay | Admitting: Internal Medicine

## 2020-01-06 ENCOUNTER — Ambulatory Visit: Payer: Medicare Other | Attending: Internal Medicine

## 2020-01-06 DIAGNOSIS — Z23 Encounter for immunization: Secondary | ICD-10-CM

## 2020-01-06 NOTE — Progress Notes (Signed)
° °  Covid-19 Vaccination Clinic  Name:  Brian Mcgrath    MRN: [id number] DOB: January 04, 1991  01/06/2020  [name] was observed post Covid-19 immunization for 15 minutes without incident. He was provided with Vaccine Information Sheet and instruction to access the V-Safe system.   [name] was instructed to call 911 with any severe reactions post vaccine:  Difficulty breathing   Swelling of face and throat   A fast heartbeat   A bad rash all over body   Dizziness and weakness   Immunizations Administered    Name Date Dose VIS Date Route   Pfizer COVID-19 Vaccine 01/06/2020 12:45 PM 0.3 mL 12/13/2019 Intramuscular   Manufacturer: [employer], [employer]   Lot: [id number]   NDC: [address]-9

## 2020-02-19 ENCOUNTER — Other Ambulatory Visit: Payer: Self-pay

## 2020-02-19 ENCOUNTER — Other Ambulatory Visit: Payer: Self-pay | Admitting: Internal Medicine

## 2020-02-20 ENCOUNTER — Other Ambulatory Visit: Payer: Self-pay | Admitting: Internal Medicine

## 2020-04-16 ENCOUNTER — Telehealth: Payer: Self-pay | Admitting: Internal Medicine

## 2020-04-16 NOTE — Telephone Encounter (Signed)
Tried calling patient to  schedule Medicare Annual Wellness Visit (AWV) either virtually or in office. ° °No answer ° ° °AWVI ° please schedule at anytime with [facility] Nurse Health Advisor 1 or 2 ° ° °This should be a 45 minute visit. °

## 2020-05-20 ENCOUNTER — Other Ambulatory Visit: Payer: Self-pay | Admitting: Internal Medicine

## 2020-05-30 LAB — HM HEPATITIS C SCREENING LAB: HM Hepatitis Screen: NEGATIVE

## 2020-06-13 ENCOUNTER — Encounter: Payer: Self-pay | Admitting: Internal Medicine

## 2020-06-25 ENCOUNTER — Encounter: Payer: Self-pay | Admitting: Internal Medicine

## 2020-07-25 ENCOUNTER — Ambulatory Visit (INDEPENDENT_AMBULATORY_CARE_PROVIDER_SITE_OTHER): Payer: Medicare Other

## 2020-07-25 DIAGNOSIS — Z0000 Encounter for general adult medical examination without abnormal findings: Secondary | ICD-10-CM

## 2020-07-25 NOTE — Progress Notes (Signed)
Subjective:   Brian Mcgrath is a 30 y.o. male who presents for an Initial Medicare Annual Wellness Visit.  Virtual Visit via Video Note  I connected with Brian Mcgrath  by a video enabled telemedicine application and verified that I am speaking with the correct person using two identifiers.  Location: Patient: Home Provider: Office Persons participating in the virtual visit: patient, provider   I discussed the limitations of evaluation and management by telemedicine and the availability of in person appointments. The patient expressed understanding and agreed to proceed.     Brian Rieger Shanterria Franta,LPN   Review of Systems    n/a       Objective:    There were no vitals filed for this visit. There is no height or weight on file to calculate BMI.  No flowsheet data found.  Current Medications (verified) Outpatient Encounter Medications as of 07/25/2020  Medication Sig  . adapalene (DIFFERIN) 0.1 % cream APPLY TOPICALLY AT BEDTIME. PEA SIZED AMOUNT TO AFFTECTED AREA AS DIRECTED. (Patient not taking: Reported on 06/05/2019)  . BIOTIN PO Take by mouth. Takes 2 gummies per day  . escitalopram (LEXAPRO) 20 MG tablet TAKE 2 TABLETS(40 MG) BY MOUTH EVERY MORNING  . guanFACINE (TENEX) 1 MG tablet TAKE 3/4 TABLET BY MOUTH TWICE DAILY  . MULTIPLE VITAMIN PO Take 1 tablet by mouth daily.  Marland Kitchen omeprazole (PRILOSEC) 20 MG capsule Take 1 capsule (20 mg total) by mouth daily. Or as directed (Patient not taking: Reported on 06/05/2019)  . valACYclovir (VALTREX) 1000 MG tablet TAKE 1 TABLET(1000 MG) BY MOUTH DAILY   No facility-administered encounter medications on file as of 07/25/2020.    Allergies (verified) Patient has no known allergies.   History: Past Medical History:  Diagnosis Date  . Acne   . Allergic rhinitis   . Autism   . FRACTURE, ARM, RIGHT 07/18/2008   Qualifier: History of  By: Fabian Sharp MD, Neta Mends   . Varicella    Past Surgical History:  Procedure Laterality Date  .  ADENOIDECTOMY    . FRACTURE SURGERY     right arm   . recircumcised    . rock removal from ear  1999  . TONSILLECTOMY  2003   Family History  Problem Relation Age of Onset  . Diverticulosis Mother   . Depression Father    Social History   Socioeconomic History  . Marital status: Single    Spouse name: Not on file  . Number of children: Not on file  . Years of education: Not on file  . Highest education level: Not on file  Occupational History  . Not on file  Tobacco Use  . Smoking status: Never Smoker  . Smokeless tobacco: Never Used  Vaping Use  . Vaping Use: Never used  Substance and Sexual Activity  . Alcohol use: No  . Drug use: Never  . Sexual activity: Not on file  Other Topics Concern  . 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  Patient Care Team: Madelin Headings, MD as PCP - General  Indicate any recent Medical Services you may have received from other than [facility] providers in the past year (date may be approximate).     Assessment:   This is a routine wellness examination for Brian Mcgrath.  Hearing/Vision screen No exam data present  Dietary issues and exercise activities discussed:    Goals Addressed   None    Depression Screen PHQ 2/9 Scores 06/05/2019 06/09/2017  PHQ - 2 Score 0 0  PHQ- 9 Score 0 -    Fall Risk No flowsheet data found.  FALL RISK PREVENTION PERTAINING TO THE HOME:  Any stairs in or  around the home? Yes  If so, are there any without handrails? No  Home free of loose throw rugs in walkways, pet beds, electrical cords, etc? Yes  Adequate lighting in your home to reduce risk of falls? Yes   ASSISTIVE DEVICES UTILIZED TO PREVENT FALLS:  Life alert? No  Use of a cane, walker or w/c? No  Grab bars in the bathroom? No  Shower chair or bench in shower? No  Elevated toilet seat or a handicapped toilet? No     Cognitive Function:   Cognitive status assessed by direct observation. Patient has current diagnosis of cognitive impairment.    Immunizations Immunization History  Administered Date(s) Administered  . Influenza Split 01/24/2012, 10/24/2012  . Influenza,inj,Quad PF,6+ Mos 12/06/2017, 11/23/2018  . PFIZER(Purple Top)SARS-COV-2 Vaccination 01/06/2020  . Td 10/24/2009    TDAP status: Due, Education has been provided regarding the importance of this vaccine. Advised may receive this vaccine at local pharmacy or Health Dept. Aware to provide a copy of the vaccination record if obtained from local pharmacy or Health Dept. Verbalized acceptance and understanding.  Flu Vaccine status: Up to date  Pneumococcal vaccine status: Declined,  Education has been provided regarding the importance of this vaccine but patient still declined. Advised may receive this vaccine at local pharmacy or Health Dept. Aware to provide a copy of the vaccination record if obtained from local pharmacy or Health Dept. Verbalized acceptance and understanding.   Covid-19 vaccine status: Completed vaccines  Qualifies for Shingles Vaccine? No   Zostavax completed No   Shingrix Completed?: No.    Education has been provided regarding the importance of this vaccine. Patient has been advised to call insurance company to determine out of pocket expense if they have not yet received this vaccine. Advised may also receive vaccine at local pharmacy or Health Dept. Verbalized acceptance and  understanding.  Screening Tests Health Maintenance  Topic Date Due  . HIV Screening  Never done  . TETANUS/TDAP  10/25/2019  . COVID-19 Vaccine (2 - Pfizer 3-dose series) 01/27/2020  . INFLUENZA VACCINE  09/23/2020  . Zoster Vaccines- Shingrix (1 of 2) 08/18/2039  . Hepatitis C Screening  Completed  . HPV VACCINES  Aged Out    Health Maintenance  Health Maintenance Due  Topic Date Due  . HIV Screening  Never done  . TETANUS/TDAP  10/25/2019  . COVID-19 Vaccine (2 - Pfizer 3-dose series) 01/27/2020    Colorectal cancer screening: No longer required.   Lung Cancer Screening: (Low Dose CT Chest recommended if Age 21-80 years, 30 pack-year currently smoking OR have  quit w/in 15years.) does not qualify.   Lung Cancer Screening Referral: n/a  Additional Screening:  Hepatitis C Screening: does not qualify  Vision Screening: Recommended annual ophthalmology exams for early detection of glaucoma and other disorders of the eye. Is the patient up to date with their annual eye exam?  No  Who is the provider or what is the name of the office in which the patient attends annual eye exams? Pt mother will schedule appointment  If pt is not established with a provider, would they like to be referred to a provider to establish care? No .   Dental Screening: Recommended annual dental exams for proper oral hygiene  Community Resource Referral / Chronic Care Management: CRR required this visit?  No   CCM required this visit?  No      Plan:     I have personally reviewed and noted the following in the patient's chart:   . Medical and social history . Use of alcohol, tobacco or illicit drugs  . Current medications and supplements including opioid prescriptions. Patient is not currently taking opioid prescriptions. . Functional ability and status . Nutritional status . Physical activity . Advanced directives . List of other physicians . Hospitalizations, surgeries, and ER visits  in previous 12 months . Vitals . Screenings to include cognitive, depression, and falls . Referrals and appointments  In addition, I have reviewed and discussed with patient certain preventive protocols, quality metrics, and best practice recommendations. A written personalized care plan for preventive services as well as general preventive health recommendations were provided to patient.     March Rummage, LPN   04/03/9369   Nurse Notes: none

## 2020-07-25 NOTE — Patient Instructions (Signed)
Brian Mcgrath , Thank you for taking time to come for your Medicare Wellness Visit. I appreciate your ongoing commitment to your health goals. Please review the following plan we discussed and let me know if I can assist you in the future.   Screening recommendations/referrals: Colonoscopy: Pt not of age  Recommended yearly ophthalmology/optometry visit for glaucoma screening and checkup Recommended yearly dental visit for hygiene and checkup  Vaccinations: Influenza vaccine: due in Fall 2022 Pneumococcal vaccine: not of age  Tdap vaccine: Due with next visit  Shingles vaccine: not of age   Advanced directives: will pick up packet at front desk   Conditions/risks identified: none   Next appointment: 09/11/2020  @1130am   Dr.Panosh   Preventive Care 40-64 Years, Male Preventive care refers to lifestyle choices and visits with your health care provider that can promote health and wellness. What does preventive care include?  A yearly physical exam. This is also called an annual well check.  Dental exams once or twice a year.  Routine eye exams. Ask your health care provider how often you should have your eyes checked.  Personal lifestyle choices, including:  Daily care of your teeth and gums.  Regular physical activity.  Eating a healthy diet.  Avoiding tobacco and drug use.  Limiting alcohol use.  Practicing safe sex.  Taking low-dose aspirin every day starting at age 80. What happens during an annual well check? The services and screenings done by your health care provider during your annual well check will depend on your age, overall health, lifestyle risk factors, and family history of disease. Counseling  Your health care provider may ask you questions about your:  Alcohol use.  Tobacco use.  Drug use.  Emotional well-being.  Home and relationship well-being.  Sexual activity.  Eating habits.  Work and work 44. Screening  You may have the  following tests or measurements:  Height, weight, and BMI.  Blood pressure.  Lipid and cholesterol levels. These may be checked every 5 years, or more frequently if you are over 56 years old.  Skin check.  Lung cancer screening. You may have this screening every year starting at age 55 if you have a 30-pack-year history of smoking and currently smoke or have quit within the past 15 years.  Fecal occult blood test (FOBT) of the stool. You may have this test every year starting at age 45.  Flexible sigmoidoscopy or colonoscopy. You may have a sigmoidoscopy every 5 years or a colonoscopy every 10 years starting at age 82.  Prostate cancer screening. Recommendations will vary depending on your family history and other risks.  Hepatitis C blood test.  Hepatitis B blood test.  Sexually transmitted disease (STD) testing.  Diabetes screening. This is done by checking your blood sugar (glucose) after you have not eaten for a while (fasting). You may have this done every 1-3 years. Discuss your test results, treatment options, and if necessary, the need for more tests with your health care provider. Vaccines  Your health care provider may recommend certain vaccines, such as:  Influenza vaccine. This is recommended every year.  Tetanus, diphtheria, and acellular pertussis (Tdap, Td) vaccine. You may need a Td booster every 10 years.  Zoster vaccine. You may need this after age 44.  Pneumococcal 13-valent conjugate (PCV13) vaccine. You may need this if you have certain conditions and have not been vaccinated.  Pneumococcal polysaccharide (PPSV23) vaccine. You may need one or two doses if you smoke cigarettes or if you  have certain conditions. Talk to your health care provider about which screenings and vaccines you need and how often you need them. This information is not intended to replace advice given to you by your health care provider. Make sure you discuss any questions you have with  your health care provider. Document Released: 03/08/2015 Document Revised: 10/30/2015 Document Reviewed: 12/11/2014 Elsevier Interactive Patient Education  2017 [employer].  Fall Prevention in the Home Falls can cause injuries. They can happen to people of all ages. There are many things you can do to make your home safe and to help prevent falls. What can I do on the outside of my home?  Regularly fix the edges of walkways and driveways and fix any cracks.  Remove anything that might make you trip as you walk through a door, such as a raised step or threshold.  Trim any bushes or trees on the path to your home.  Use bright outdoor lighting.  Clear any walking paths of anything that might make someone trip, such as rocks or tools.  Regularly check to see if handrails are loose or broken. Make sure that both sides of any steps have handrails.  Any raised decks and porches should have guardrails on the edges.  Have any leaves, snow, or ice cleared regularly.  Use sand or salt on walking paths during winter.  Clean up any spills in your garage right away. This includes oil or grease spills. What can I do in the bathroom?  Use night lights.  Install grab bars by the toilet and in the tub and shower. Do not use towel bars as grab bars.  Use non-skid mats or decals in the tub or shower.  If you need to sit down in the shower, use a plastic, non-slip stool.  Keep the floor dry. Clean up any water that spills on the floor as soon as it happens.  Remove soap buildup in the tub or shower regularly.  Attach bath mats securely with double-sided non-slip rug tape.  Do not have throw rugs and other things on the floor that can make you trip. What can I do in the bedroom?  Use night lights.  Make sure that you have a light by your bed that is easy to reach.  Do not use any sheets or blankets that are too big for your bed. They should not hang down onto the floor.  Have a firm  chair that has side arms. You can use this for support while you get dressed.  Do not have throw rugs and other things on the floor that can make you trip. What can I do in the kitchen?  Clean up any spills right away.  Avoid walking on wet floors.  Keep items that you use a lot in easy-to-reach places.  If you need to reach something above you, use a strong step stool that has a grab bar.  Keep electrical cords out of the way.  Do not use floor polish or wax that makes floors slippery. If you must use wax, use non-skid floor wax.  Do not have throw rugs and other things on the floor that can make you trip. What can I do with my stairs?  Do not leave any items on the stairs.  Make sure that there are handrails on both sides of the stairs and use them. Fix handrails that are broken or loose. Make sure that handrails are as long as the stairways.  Check any carpeting  to make sure that it is firmly attached to the stairs. Fix any carpet that is loose or worn.  Avoid having throw rugs at the top or bottom of the stairs. If you do have throw rugs, attach them to the floor with carpet tape.  Make sure that you have a light switch at the top of the stairs and the bottom of the stairs. If you do not have them, ask someone to add them for you. What else can I do to help prevent falls?  Wear shoes that:  Do not have high heels.  Have rubber bottoms.  Are comfortable and fit you well.  Are closed at the toe. Do not wear sandals.  If you use a stepladder:  Make sure that it is fully opened. Do not climb a closed stepladder.  Make sure that both sides of the stepladder are locked into place.  Ask someone to hold it for you, if possible.  Clearly mark and make sure that you can see:  Any grab bars or handrails.  First and last steps.  Where the edge of each step is.  Use tools that help you move around (mobility aids) if they are needed. These  include:  Canes.  Walkers.  Scooters.  Crutches.  Turn on the lights when you go into a dark area. Replace any light bulbs as soon as they burn out.  Set up your furniture so you have a clear path. Avoid moving your furniture around.  If any of your floors are uneven, fix them.  If there are any pets around you, be aware of where they are.  Review your medicines with your doctor. Some medicines can make you feel dizzy. This can increase your chance of falling. Ask your doctor what other things that you can do to help prevent falls. This information is not intended to replace advice given to you by your health care provider. Make sure you discuss any questions you have with your health care provider. Document Released: 12/06/2008 Document Revised: 07/18/2015 Document Reviewed: 03/16/2014 Elsevier Interactive Patient Education  2017 [employer].

## 2020-08-10 ENCOUNTER — Other Ambulatory Visit: Payer: Self-pay | Admitting: Internal Medicine

## 2020-08-20 ENCOUNTER — Other Ambulatory Visit: Payer: Self-pay | Admitting: Internal Medicine

## 2020-08-28 ENCOUNTER — Encounter: Payer: Medicare Other | Admitting: Internal Medicine

## 2020-09-06 ENCOUNTER — Telehealth: Payer: Self-pay | Admitting: Internal Medicine

## 2020-09-06 NOTE — Telephone Encounter (Signed)
Pts mother is calling in needing to r/s his CPE due to her having surgery on 09/10/2020 and she is trying to r/s her and the pt on the same day due to the son does not drive.  Mother was wanting to see if they could be r/s'd on 10/14/2020 Monday in the afternoon back to back for their CPE.  Mother would like to have a call back.

## 2020-09-06 NOTE — Telephone Encounter (Signed)
Pts mother was called and rescheduled with the approval from Dr. Fabian Sharp

## 2020-09-09 NOTE — Telephone Encounter (Signed)
Noted  

## 2020-09-11 ENCOUNTER — Encounter: Payer: Medicare Other | Admitting: Internal Medicine

## 2020-09-20 ENCOUNTER — Other Ambulatory Visit: Payer: Self-pay | Admitting: Internal Medicine

## 2020-09-24 ENCOUNTER — Other Ambulatory Visit: Payer: Self-pay

## 2020-09-24 MED ORDER — GUANFACINE HCL 1 MG PO TABS: ORAL_TABLET | ORAL | 0 refills | Status: DC

## 2020-10-14 ENCOUNTER — Ambulatory Visit (INDEPENDENT_AMBULATORY_CARE_PROVIDER_SITE_OTHER): Payer: Medicare Other | Admitting: Internal Medicine

## 2020-10-14 ENCOUNTER — Other Ambulatory Visit: Payer: Self-pay

## 2020-10-14 ENCOUNTER — Encounter: Payer: Self-pay | Admitting: Internal Medicine

## 2020-10-14 VITALS — BP 100/70 | HR 79 | Temp 98.5°F | Ht 69.0 in | Wt 184.8 lb

## 2020-10-14 DIAGNOSIS — Z0000 Encounter for general adult medical examination without abnormal findings: Secondary | ICD-10-CM | POA: Diagnosis not present

## 2020-10-14 DIAGNOSIS — L659 Nonscarring hair loss, unspecified: Secondary | ICD-10-CM | POA: Diagnosis not present

## 2020-10-14 DIAGNOSIS — F840 Autistic disorder: Secondary | ICD-10-CM

## 2020-10-14 DIAGNOSIS — Z23 Encounter for immunization: Secondary | ICD-10-CM

## 2020-10-14 DIAGNOSIS — R4586 Emotional lability: Secondary | ICD-10-CM

## 2020-10-14 DIAGNOSIS — Z79899 Other long term (current) drug therapy: Secondary | ICD-10-CM

## 2020-10-14 NOTE — Progress Notes (Signed)
Chief Complaint  Patient presents with   Annual Exam     HPI: Patient  Brian Mcgrath  30 y.o. comes in today for Preventive Health Care visit  visit alone and info from mom. Stable on meds   some concern about any intervention that could help balding.  Mood ok  having jobs  Visit with patient and additional disc with mom.  Health Maintenance  Topic Date Due   Pneumococcal Vaccine 23-6 Years old (1 - PCV) Never done   HIV Screening  Never done   COVID-19 Vaccine (2 - Pfizer risk series) 01/27/2020   INFLUENZA VACCINE  09/23/2020   TETANUS/TDAP  10/15/2030   Hepatitis C Screening  Completed   HPV VACCINES  Aged Out   Health Maintenance Review LIFESTYLE:  Exercise:  active  Tobacco/ETS:n Alcohol: n Sugar beverages:n Sleep:well  but not recently new puppy  [place] Drug use: no HH of 3 Work: 9 plus hours per week     ROS:  Some concern about increasing balding  mom ask a out low does oral minoxidil reading other .   Past Medical History:  Diagnosis Date   Acne    Allergic rhinitis    Autism    FRACTURE, ARM, RIGHT 07/18/2008   Qualifier: History of  By: Fabian Sharp MD, Neta Mends    Varicella     Past Surgical History:  Procedure Laterality Date   ADENOIDECTOMY     FRACTURE SURGERY     right arm    recircumcised     rock removal from ear  1999   TONSILLECTOMY  2003    Family History  Problem Relation Age of Onset   Diverticulosis Mother    Depression Father     Social History   Socioeconomic History   Marital status: Single    Spouse name: Not on file   Number of children: Not on file   Years of education: Not on file   Highest education level: Not on file  Occupational History   Not on file  Tobacco Use   Smoking status: Never   Smokeless tobacco: Never  Vaping Use   Vaping Use: Never used  Substance and Sexual Activity   Alcohol use: No   Drug use: Never   Sexual activity: Not on file  Other Topics Concern   Not on file  Social History  Narrative   Lives at home with parents is working 3 days a week  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[employer]       Older brother      Social Determinants of Corporate investment banker Strain: Low Risk    Difficulty of Paying Living Expenses: Not hard at all  Food Insecurity: No Food Insecurity   Worried About Programme researcher, broadcasting/film/video in the Last Year: Never true   Barista in the Last Year: Never true  Transportation Needs: No Transportation Needs   Lack of Transportation (Medical): No   Lack of Transportation (Non-Medical): No  Physical Activity: Sufficiently Active   Days of Exercise per Week: 5 days   Minutes of Exercise per Session: 60 min  Stress: No Stress Concern Present   Feeling of Stress : Not at all  Social Connections: Moderately Integrated   Frequency of Communication with Friends and Family: Three times a week   Frequency of Social Gatherings with Friends and Family: Three times a week   Attends Religious Services: More than 4  times per year   Active Member of Clubs or Organizations: Yes   Attends Banker Meetings: More than 4 times per year   Marital Status: Never married    Outpatient Medications Prior to Visit  Medication Sig Dispense Refill   adapalene (DIFFERIN) 0.1 % cream APPLY TOPICALLY AT BEDTIME. PEA SIZED AMOUNT TO AFFTECTED AREA AS DIRECTED. 45 g 3   BIOTIN PO Take by mouth. Takes 2 gummies per day     escitalopram (LEXAPRO) 20 MG tablet TAKE 2 TABLETS(40 MG) BY MOUTH EVERY MORNING 60 tablet 0   MULTIPLE VITAMIN PO Take 1 tablet by mouth daily.     omeprazole (PRILOSEC) 20 MG capsule Take 1 capsule (20 mg total) by mouth daily. Or as directed 30 capsule 2   guanFACINE (TENEX) 1 MG tablet TAKE 3/4 TABLET BY MOUTH TWICE DAILY 135 tablet 0   valACYclovir (VALTREX) 1000 MG tablet TAKE 1 TABLET(1000 MG) BY MOUTH DAILY 30 tablet 0   No facility-administered medications prior to visit.     EXAM:  BP 100/70 (BP Location:  Left Arm, Patient Position: Sitting, Cuff Size: Normal)   Pulse 79   Temp 98.5 F (36.9 C) (Oral)   Ht 5\' 9"  (1.753 m)   Wt 184 lb 12.8 oz (83.8 kg)   SpO2 95%   BMI 27.29 kg/m   Body mass index is 27.29 kg/m. Wt Readings from Last 3 Encounters:  10/14/20 184 lb 12.8 oz (83.8 kg)  06/05/19 179 lb 3.2 oz (81.3 kg)  06/09/17 179 lb 1.6 oz (81.2 kg)    Physical Exam: Vital signs reviewed 06/11/17 is a well-developed well-nourished alert cooperative    who appearsr stated age in no acute distress.  HEENT: normocephalic atraumatic , Eyes: PERRL EOM' unable to follow that direction  conjunctiva clear, deformity , Ears: no deformity EAC's clear TMs with normal landmarks 2+ wax  Mouth: masked  NECK: supple without masses, thyromegaly or bruits. CHEST/PULM:  Clear to auscultation and percussion breath sounds equal no wheeze , rales or rhonchi. No chest wall deformities or tenderness. CV: PMI is nondisplaced, S1 S2 no gallops, murmurs, rubs. Peripheral pulses are full without delay.No JVD .  ABDOMEN: Bowel sounds normal nontender  No guard or rebound, no hepato splenomegal no CVA tenderness. Extremtities:  No clubbing cyanosis or edema, no acute joint swelling or redness no focal atrophy NEURO:  Oriented x3, cranial nerves 3-12 appear to be intact, no obvious focal weakness,gait within normal limits no abnormal reflexes or asymmetrical SKIN: No acute rashes normal turgor, color, no bruising or petechiae.  Few acne area upper back .  Scalp significant balding  no striae  PSYCH: Oriented, good eye contact,  monotone autistic talk and behavior cooperative   LN: no cervical axillary adenopathy  Lab Results  Component Value Date   WBC 3.9 (L) 06/05/2019   HGB 15.5 06/05/2019   HCT 45.3 06/05/2019   PLT 202.0 06/05/2019   GLUCOSE 83 06/05/2019   CHOL 170 06/05/2019   TRIG 53.0 06/05/2019   HDL 44.50 06/05/2019   LDLDIRECT 102.0 05/23/2014   LDLCALC 114 (H) 06/05/2019   ALT 11 06/05/2019    AST 15 06/05/2019   NA 140 06/05/2019   K 4.5 06/05/2019   CL 101 06/05/2019   CREATININE 0.84 06/05/2019   BUN 13 06/05/2019   CO2 32 06/05/2019   TSH 3.58 06/05/2019    BP Readings from Last 3 Encounters:  10/14/20 100/70  06/05/19 114/72  06/09/17 128/68  Lab results reviewed with patient   ASSESSMENT AND PLAN:  Discussed the following assessment and plan:    ICD-10-CM   1. Visit for preventive health examination  Z00.00     2. Medication management  Z79.899     3. Autism spectrum disorder  F84.0     4. Mood disturbance stable at this time   R45.86     5. Balding  L65.9     6. Need for Td vaccine  Z23 Td : Tetanus/diphtheria >7yo Preservative  free    No lab today   but continue medication Uncertain next step for balding but is more severe than  his dads.   He is concerned conscious about this  Uncertain if  low dose oral minoxidil will help or other   Consider updated   Derm evaluation.  Return in about 1 year (around 10/14/2021) for as indicated.  Patient Care Team: Madelin Headings, MD as PCP - General Patient Instructions  Good to see you tdoay. Continue lifestyle intervention healthy eating and exercise .   Plan  td  booster today .  Will explore consults  to see if can help the  hair loss.   Health Maintenance, Male Adopting a healthy lifestyle and getting preventive care are important in promoting health and wellness. Ask your health care provider about: The right schedule for you to have regular tests and exams. Things you can do on your own to prevent diseases and keep yourself healthy. What should I know about diet, weight, and exercise? Eat a healthy diet  Eat a diet that includes plenty of vegetables, fruits, low-fat dairy products, and lean protein. Do not eat a lot of foods that are high in solid fats, added sugars, or sodium.  Maintain a healthy weight Body mass index (BMI) is a measurement that can be used to identify possible weight  problems. It estimates body fat based on height and weight. Your health care provider can help determine your BMI and help you achieve or maintain ahealthy weight. Get regular exercise Get regular exercise. This is one of the most important things you can do for your health. Most adults should: Exercise for at least 150 minutes each week. The exercise should increase your heart rate and make you sweat (moderate-intensity exercise). Do strengthening exercises at least twice a week. This is in addition to the moderate-intensity exercise. Spend less time sitting. Even light physical activity can be beneficial. Watch cholesterol and blood lipids Have your blood tested for lipids and cholesterol at 31 years of age, then havethis test every 5 years. You may need to have your cholesterol levels checked more often if: Your lipid or cholesterol levels are high. You are older than 31 years of age. You are at high risk for heart disease. What should I know about cancer screening? Many types of cancers can be detected early and may often be prevented. Depending on your health history and family history, you may need to have cancer screening at various ages. This may include screening for: Colorectal cancer. Prostate cancer. Skin cancer. Lung cancer. What should I know about heart disease, diabetes, and high blood pressure? Blood pressure and heart disease High blood pressure causes heart disease and increases the risk of stroke. This is more likely to develop in people who have high blood pressure readings, are of African descent, or are overweight. Talk with your health care provider about your target blood pressure readings. Have your blood pressure checked: Every 3-5 years  if you are 2418-31 years of age. Every year if you are 421 years old or older. If you are between the ages of 6265 and 5975 and are a current or former smoker, ask your health care provider if you should have a one-time screening for  abdominal aortic aneurysm (AAA). Diabetes Have regular diabetes screenings. This checks your fasting blood sugar level. Have the screening done: Once every three years after age 31 if you are at a normal weight and have a low risk for diabetes. More often and at a younger age if you are overweight or have a high risk for diabetes. What should I know about preventing infection? Hepatitis B If you have a higher risk for hepatitis B, you should be screened for this virus. Talk with your health care provider to find out if you are at risk forhepatitis B infection. Hepatitis C Blood testing is recommended for: Everyone born from [birth year] through 1965. Anyone with known risk factors for hepatitis C. Sexually transmitted infections (STIs) You should be screened each year for STIs, including gonorrhea and chlamydia, if: You are sexually active and are younger than 31 years of age. You are older than 31 years of age and your health care provider tells you that you are at risk for this type of infection. Your sexual activity has changed since you were last screened, and you are at increased risk for chlamydia or gonorrhea. Ask your health care provider if you are at risk. Ask your health care provider about whether you are at high risk for HIV. Your health care provider may recommend a prescription medicine to help prevent HIV infection. If you choose to take medicine to prevent HIV, you should first get tested for HIV. You should then be tested every 3 months for as long as you are taking the medicine. Follow these instructions at home: Lifestyle Do not use any products that contain nicotine or tobacco, such as cigarettes, e-cigarettes, and chewing tobacco. If you need help quitting, ask your health care provider. Do not use street drugs. Do not share needles. Ask your health care provider for help if you need support or information about quitting drugs. Alcohol use Do not drink alcohol if your health  care provider tells you not to drink. If you drink alcohol: Limit how much you have to 0-2 drinks a day. Be aware of how much alcohol is in your drink. In the [place] one drink equals one 12 oz bottle of beer (355 mL), one 5 oz glass of wine (148 mL), or one 1 oz glass of hard liquor (44 mL). General instructions Schedule regular health, dental, and eye exams. Stay current with your vaccines. Tell your health care provider if: You often feel depressed. You have ever been abused or do not feel safe at home. Summary Adopting a healthy lifestyle and getting preventive care are important in promoting health and wellness. Follow your health care provider's instructions about healthy diet, exercising, and getting tested or screened for diseases. Follow your health care provider's instructions on monitoring your cholesterol and blood pressure. This information is not intended to replace advice given to you by your health care provider. Make sure you discuss any questions you have with your healthcare provider. Document Revised: 02/02/2018 Document Reviewed: 02/02/2018 Elsevier Patient Education  2022 [employer]  ClarksdaleWanda K. Marypat Kimmet M.D.

## 2020-10-14 NOTE — Patient Instructions (Signed)
Good to see you tdoay. Continue lifestyle intervention healthy eating and exercise .   Plan  td  booster today .  Will explore consults  to see if can help the  hair loss.   Health Maintenance, Male Adopting a healthy lifestyle and getting preventive care are important in promoting health and wellness. Ask your health care provider about: The right schedule for you to have regular tests and exams. Things you can do on your own to prevent diseases and keep yourself healthy. What should I know about diet, weight, and exercise? Eat a healthy diet  Eat a diet that includes plenty of vegetables, fruits, low-fat dairy products, and lean protein. Do not eat a lot of foods that are high in solid fats, added sugars, or sodium.  Maintain a healthy weight Body mass index (BMI) is a measurement that can be used to identify possible weight problems. It estimates body fat based on height and weight. Your health care provider can help determine your BMI and help you achieve or maintain ahealthy weight. Get regular exercise Get regular exercise. This is one of the most important things you can do for your health. Most adults should: Exercise for at least 150 minutes each week. The exercise should increase your heart rate and make you sweat (moderate-intensity exercise). Do strengthening exercises at least twice a week. This is in addition to the moderate-intensity exercise. Spend less time sitting. Even light physical activity can be beneficial. Watch cholesterol and blood lipids Have your blood tested for lipids and cholesterol at 31 years of age, then havethis test every 5 years. You may need to have your cholesterol levels checked more often if: Your lipid or cholesterol levels are high. You are older than 31 years of age. You are at high risk for heart disease. What should I know about cancer screening? Many types of cancers can be detected early and may often be prevented. Depending on your health  history and family history, you may need to have cancer screening at various ages. This may include screening for: Colorectal cancer. Prostate cancer. Skin cancer. Lung cancer. What should I know about heart disease, diabetes, and high blood pressure? Blood pressure and heart disease High blood pressure causes heart disease and increases the risk of stroke. This is more likely to develop in people who have high blood pressure readings, are of African descent, or are overweight. Talk with your health care provider about your target blood pressure readings. Have your blood pressure checked: Every 3-5 years if you are 23-40 years of age. Every year if you are 90 years old or older. If you are between the ages of 2 and 54 and are a current or former smoker, ask your health care provider if you should have a one-time screening for abdominal aortic aneurysm (AAA). Diabetes Have regular diabetes screenings. This checks your fasting blood sugar level. Have the screening done: Once every three years after age 53 if you are at a normal weight and have a low risk for diabetes. More often and at a younger age if you are overweight or have a high risk for diabetes. What should I know about preventing infection? Hepatitis B If you have a higher risk for hepatitis B, you should be screened for this virus. Talk with your health care provider to find out if you are at risk forhepatitis B infection. Hepatitis C Blood testing is recommended for: Everyone born from [birth year] through 1966. Anyone with known risk factors for hepatitis  C. Sexually transmitted infections (STIs) You should be screened each year for STIs, including gonorrhea and chlamydia, if: You are sexually active and are younger than 31 years of age. You are older than 31 years of age and your health care provider tells you that you are at risk for this type of infection. Your sexual activity has changed since you were last screened, and you are  at increased risk for chlamydia or gonorrhea. Ask your health care provider if you are at risk. Ask your health care provider about whether you are at high risk for HIV. Your health care provider may recommend a prescription medicine to help prevent HIV infection. If you choose to take medicine to prevent HIV, you should first get tested for HIV. You should then be tested every 3 months for as long as you are taking the medicine. Follow these instructions at home: Lifestyle Do not use any products that contain nicotine or tobacco, such as cigarettes, e-cigarettes, and chewing tobacco. If you need help quitting, ask your health care provider. Do not use street drugs. Do not share needles. Ask your health care provider for help if you need support or information about quitting drugs. Alcohol use Do not drink alcohol if your health care provider tells you not to drink. If you drink alcohol: Limit how much you have to 0-2 drinks a day. Be aware of how much alcohol is in your drink. In the [place] one drink equals one 12 oz bottle of beer (355 mL), one 5 oz glass of wine (148 mL), or one 1 oz glass of hard liquor (44 mL). General instructions Schedule regular health, dental, and eye exams. Stay current with your vaccines. Tell your health care provider if: You often feel depressed. You have ever been abused or do not feel safe at home. Summary Adopting a healthy lifestyle and getting preventive care are important in promoting health and wellness. Follow your health care provider's instructions about healthy diet, exercising, and getting tested or screened for diseases. Follow your health care provider's instructions on monitoring your cholesterol and blood pressure. This information is not intended to replace advice given to you by your health care provider. Make sure you discuss any questions you have with your healthcare provider. Document Revised: 02/02/2018 Document Reviewed:  02/02/2018 Elsevier Patient Education  2022 [employer].

## 2020-10-24 ENCOUNTER — Other Ambulatory Visit: Payer: Self-pay | Admitting: Internal Medicine

## 2020-10-31 ENCOUNTER — Telehealth: Payer: Self-pay

## 2020-10-31 NOTE — Telephone Encounter (Signed)
Pt mother was given lab results for herself and had a question regarding pt hair loss. Pt mother stated that pt had a CPE and was told pt would have a dermatology referral placed for hair loss. I advised tha a message would be routed to Dr. Fabian Sharp for advise.

## 2020-11-01 ENCOUNTER — Telehealth: Payer: Self-pay

## 2020-11-01 DIAGNOSIS — L659 Nonscarring hair loss, unspecified: Secondary | ICD-10-CM

## 2020-11-01 NOTE — Telephone Encounter (Signed)
I spoke with the pt's mother and she has been informed of the message below and verbalized understanding. Pt's mother has agreed to the referral and referral has been placed.

## 2020-11-01 NOTE — Telephone Encounter (Signed)
-----   Message from Madelin Headings, MD sent at 10/31/2020  1:09 PM EDT ----- Sharnese Heath  please tell mom  that maybe dept [facility](not  here)  could give best advice . For the balding  intervention. Can see what they think about oral medications.  Can do a referral if  agree.  Thanks [facility]

## 2020-11-02 NOTE — Telephone Encounter (Signed)
Although delayed this referral has been done  and informing of this in passing messages

## 2020-11-04 NOTE — Telephone Encounter (Signed)
Referral has been placed and pt's mother is aware

## 2020-11-19 ENCOUNTER — Other Ambulatory Visit: Payer: Self-pay | Admitting: Internal Medicine

## 2020-11-25 ENCOUNTER — Other Ambulatory Visit: Payer: Self-pay | Admitting: Internal Medicine

## 2020-12-24 ENCOUNTER — Other Ambulatory Visit: Payer: Self-pay | Admitting: Internal Medicine

## 2021-01-13 ENCOUNTER — Other Ambulatory Visit: Payer: Self-pay | Admitting: Internal Medicine

## 2021-02-28 ENCOUNTER — Other Ambulatory Visit: Payer: Self-pay | Admitting: Internal Medicine

## 2021-02-28 NOTE — Telephone Encounter (Signed)
Patient is on last pill--didn't know he was that low due to being on vacation.  Mother is requesting a call back [phone] or [phone].

## 2021-03-03 ENCOUNTER — Other Ambulatory Visit: Payer: Self-pay

## 2021-03-03 MED ORDER — VALACYCLOVIR HCL 1 G PO TABS: ORAL_TABLET | ORAL | 0 refills | Status: DC

## 2021-03-03 NOTE — Telephone Encounter (Signed)
Rx sent to the pharmacy.

## 2021-03-03 NOTE — Telephone Encounter (Signed)
Patient is calling back asking for an update on medication request. Patient has already missed two doses and needs his medication.  Please advise.

## 2021-03-19 DIAGNOSIS — L649 Androgenic alopecia, unspecified: Secondary | ICD-10-CM | POA: Diagnosis not present

## 2021-04-28 ENCOUNTER — Other Ambulatory Visit: Payer: Self-pay | Admitting: Internal Medicine

## 2021-05-30 ENCOUNTER — Other Ambulatory Visit: Payer: Self-pay | Admitting: Internal Medicine

## 2021-06-28 ENCOUNTER — Other Ambulatory Visit: Payer: Self-pay | Admitting: Internal Medicine

## 2021-07-02 ENCOUNTER — Other Ambulatory Visit: Payer: Self-pay | Admitting: Internal Medicine

## 2021-07-24 ENCOUNTER — Telehealth: Payer: Self-pay | Admitting: Internal Medicine

## 2021-07-24 NOTE — Telephone Encounter (Signed)
Left message for patient to call back and schedule Medicare Annual Wellness Visit (AWV) either virtually or in office. Left  my jabber number [phone]   Last AWV 07/25/20 ; please schedule at anytime with [facility] Nurse Health Advisor 1 or 2    

## 2021-07-31 ENCOUNTER — Other Ambulatory Visit: Payer: Self-pay | Admitting: Internal Medicine

## 2021-08-04 ENCOUNTER — Ambulatory Visit (INDEPENDENT_AMBULATORY_CARE_PROVIDER_SITE_OTHER): Payer: Medicare Other

## 2021-08-04 VITALS — Ht 70.0 in | Wt 173.0 lb

## 2021-08-04 DIAGNOSIS — Z0000 Encounter for general adult medical examination without abnormal findings: Secondary | ICD-10-CM | POA: Diagnosis not present

## 2021-08-04 NOTE — Patient Instructions (Signed)
Brian Mcgrath,  Thank you for taking time to come for your Medicare Wellness Visit. I appreciate your ongoing commitment to your health goals. Please review the following plan we discussed and let me know if I can assist you in the future.   Screening recommendations/referrals: Colonoscopy: n/a Recommended yearly ophthalmology/optometry visit for glaucoma screening and checkup Recommended yearly dental visit for hygiene and checkup  Vaccinations: Influenza vaccine: due 09/23/2021 Pneumococcal vaccine: n/a Tdap vaccine: completed 10/14/2020, due 10/15/2030 Shingles vaccine: n/a   Covid-19: 05/05/2019, 05/29/2019, 01/06/2020  Advanced directives: Advance directive discussed with you today.   Conditions/risks identified: none  Next appointment: Follow up in one year for your annual wellness visit   Preventive Care 32-3 Years Old, Male Preventive care refers to lifestyle choices and visits with your health care provider that can promote health and wellness. Preventive care visits are also called wellness exams. What can I expect for my preventive care visit? Counseling During your preventive care visit, your health care provider may ask about your: Medical history, including: Past medical problems. Family medical history. Current health, including: Emotional well-being. Home life and relationship well-being. Sexual activity. Lifestyle, including: Alcohol, nicotine or tobacco, and drug use. Access to firearms. Diet, exercise, and sleep habits. Safety issues such as seatbelt and bike helmet use. Sunscreen use. Work and work Astronomer. Physical exam Your health care provider may check your: Height and weight. These may be used to calculate your BMI (body mass index). BMI is a measurement that tells if you are at a healthy weight. Waist circumference. This measures the distance around your waistline. This measurement also tells if you are at a healthy weight and may help predict your risk  of certain diseases, such as type 2 diabetes and high blood pressure. Heart rate and blood pressure. Body temperature. Skin for abnormal spots. What immunizations do I need? Vaccines are usually given at various ages, according to a schedule. Your health care provider will recommend vaccines for you based on your age, medical history, and lifestyle or other factors, such as travel or where you work. What tests do I need? Screening Your health care provider may recommend screening tests for certain conditions. This may include: Lipid and cholesterol levels. Diabetes screening. This is done by checking your blood sugar (glucose) after you have not eaten for a while (fasting). Hepatitis B test. Hepatitis C test. HIV (human immunodeficiency virus) test. STI (sexually transmitted infection) testing, if you are at risk. Talk with your health care provider about your test results, treatment options, and if necessary, the need for more tests. Follow these instructions at home: Eating and drinking  Eat a healthy diet that includes fresh fruits and vegetables, whole grains, lean protein, and low-fat dairy products. Drink enough fluid to keep your urine pale yellow. Take vitamin and mineral supplements as recommended by your health care provider. Do not drink alcohol if your health care provider tells you not to drink. If you drink alcohol: Limit how much you have to 0-2 drinks a day. Know how much alcohol is in your drink. In the [place] one drink equals one 12 oz bottle of beer (355 mL), one 5 oz glass of wine (148 mL), or one 1 oz glass of hard liquor (44 mL). Lifestyle Brush your teeth every morning and night with fluoride toothpaste. Floss one time each day. Exercise for at least 30 minutes 5 or more days each week. Do not use any products that contain nicotine or tobacco. These products include cigarettes,  chewing tobacco, and vaping devices, such as e-cigarettes. If you need help quitting,  ask your health care provider. Do not use drugs. If you are sexually active, practice safe sex. Use a condom or other form of protection to prevent STIs. Find healthy ways to manage stress, such as: Meditation, yoga, or listening to music. Journaling. Talking to a trusted person. Spending time with friends and family. Minimize exposure to UV radiation to reduce your risk of skin cancer. Safety Always wear your seat belt while driving or riding in a vehicle. Do not drive: If you have been drinking alcohol. Do not ride with someone who has been drinking. If you have been using any mind-altering substances or drugs. While texting. When you are tired or distracted. Wear a helmet and other protective equipment during sports activities. If you have firearms in your house, make sure you follow all gun safety procedures. Seek help if you have been physically or sexually abused. What's next? Go to your health care provider once a year for an annual wellness visit. Ask your health care provider how often you should have your eyes and teeth checked. Stay up to date on all vaccines. This information is not intended to replace advice given to you by your health care provider. Make sure you discuss any questions you have with your health care provider. Document Revised: 08/07/2020 Document Reviewed: 08/07/2020 Elsevier Patient Education  2022 [employer].

## 2021-08-04 NOTE — Progress Notes (Signed)
I connected with Brian Mcgrath today by telephone and verified that I am speaking with the correct person using two identifiers. Location patient: home Location provider: work Persons participating in the virtual visit: Brian Mcgrath, Sharee Pimple (mom), Elisha Ponder LPN.   I discussed the limitations, risks, security and privacy concerns of performing an evaluation and management service by telephone and the availability of in person appointments. I also discussed with the patient that there may be a patient responsible charge related to this service. The patient expressed understanding and verbally consented to this telephonic visit.    Interactive audio and video telecommunications were attempted between this provider and patient, however failed, due to patient having technical difficulties OR patient did not have access to video capability.  We continued and completed visit with audio only.     Vital signs may be patient reported or missing.  Subjective:   Brian Mcgrath is a 31 y.o. male who presents for Medicare Annual/Subsequent preventive examination.  Review of Systems     Cardiac Risk Factors include: male gender     Objective:    Today's Vitals   08/04/21 1030  Weight: 173 lb (78.5 kg)  Height: 5\' 10"  (1.778 m)   Body mass index is 24.82 kg/m.     08/04/2021   10:36 AM 07/25/2020    8:26 AM  Advanced Directives  Does Patient Have a Medical Advance Directive? No No    Current Medications (verified) Outpatient Encounter Medications as of 08/04/2021  Medication Sig   adapalene (DIFFERIN) 0.1 % cream APPLY TOPICALLY AT BEDTIME. PEA SIZED AMOUNT TO AFFTECTED AREA AS DIRECTED.   BIOTIN PO Take by mouth. Takes 2 gummies per day   escitalopram (LEXAPRO) 20 MG tablet TAKE 2 TABLETS(40 MG) BY MOUTH EVERY MORNING   guanFACINE (TENEX) 1 MG tablet TAKE 3/4 TABLET BY MOUTH TWICE DAILY   MULTIPLE VITAMIN PO Take 1 tablet by mouth daily.   valACYclovir (VALTREX) 1000 MG tablet  TAKE 1 TABLET(1000 MG) BY MOUTH DAILY   omeprazole (PRILOSEC) 20 MG capsule Take 1 capsule (20 mg total) by mouth daily. Or as directed (Patient not taking: Reported on 08/04/2021)   No facility-administered encounter medications on file as of 08/04/2021.    Allergies (verified) Patient has no known allergies.   History: Past Medical History:  Diagnosis Date   Acne    Allergic rhinitis    Autism    FRACTURE, ARM, RIGHT 07/18/2008   Qualifier: History of  By: 07/20/2008 MD, Fabian Sharp    Varicella    Past Surgical History:  Procedure Laterality Date   ADENOIDECTOMY     FRACTURE SURGERY     right arm    recircumcised     rock removal from ear  1999   TONSILLECTOMY  2003   Family History  Problem Relation Age of Onset   Diverticulosis Mother    Depression Father    Social History   Socioeconomic History   Marital status: Single    Spouse name: Not on file   Number of children: Not on file   Years of education: Not on file   Highest education level: Not on file  Occupational History   Not on file  Tobacco Use   Smoking status: Never   Smokeless tobacco: Never  Vaping Use   Vaping Use: Never used  Substance and Sexual Activity   Alcohol use: No   Drug use: Never   Sexual activity: Not on file  Other Topics Concern  Not on file  Social History Narrative   Lives at home with parents is working 3 days a week  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[employer]       Older brother      Social Determinants of Health   Financial Resource Strain: Low Risk  (08/04/2021)   Overall Financial Resource Strain (CARDIA)    Difficulty of Paying Living Expenses: Not hard at all  Food Insecurity: No Food Insecurity (08/04/2021)   Hunger Vital Sign    Worried About Running Out of Food in the Last Year: Never true    Ran Out of Food in the Last Year: Never true  Transportation Needs: No Transportation Needs (08/04/2021)   PRAPARE - Administrator, Civil Service  (Medical): No    Lack of Transportation (Non-Medical): No  Physical Activity: Sufficiently Active (08/04/2021)   Exercise Vital Sign    Days of Exercise per Week: 6 days    Minutes of Exercise per Session: 60 min  Stress: No Stress Concern Present (08/04/2021)   [employer] of Occupational Health - Occupational Stress Questionnaire    Feeling of Stress : Not at all  Social Connections: Moderately Integrated (07/25/2020)   Social Connection and Isolation Panel [NHANES]    Frequency of Communication with Friends and Family: Three times a week    Frequency of Social Gatherings with Friends and Family: Three times a week    Attends Religious Services: More than 4 times per year    Active Member of Clubs or Organizations: Yes    Attends Banker Meetings: More than 4 times per year    Marital Status: Never married    Tobacco Counseling Counseling given: Not Answered   Clinical Intake:  Pre-visit preparation completed: Yes  Pain : No/denies pain     Nutritional Status: BMI of 19-24  Normal Nutritional Risks: None Diabetes: No  How often do you need to have someone help you when you read instructions, pamphlets, or other written materials from your doctor or pharmacy?: 1 - Never What is the last grade level you completed in school?: home schooled  Diabetic? no  Interpreter Needed?: No  Information entered by :: NAllen LPN   Activities of Daily Living    08/04/2021   10:37 AM  In your present state of health, do you have any difficulty performing the following activities:  Hearing? 0  Vision? 0  Difficulty concentrating or making decisions? 0  Walking or climbing stairs? 0  Dressing or bathing? 0  Doing errands, shopping? 0  Preparing Food and eating ? N  Using the Toilet? N  In the past six months, have you accidently leaked urine? N  Do you have problems with loss of bowel control? N  Managing your Medications? Y  Managing your Finances? N   Housekeeping or managing your Housekeeping? N    Patient Care Team: Panosh, Neta Mends, MD as PCP - General  Indicate any recent Medical Services you may have received from other than [facility] providers in the past year (date may be approximate).     Assessment:   This is a routine wellness examination for Brian Mcgrath.  Hearing/Vision screen Vision Screening - Comments:: No regular eye exams,  Dietary issues and exercise activities discussed: Current Exercise Habits: Home exercise routine, Type of exercise: walking;strength training/weights, Time (Minutes): 60, Frequency (Times/Week): 6, Weekly Exercise (Minutes/Week): 360   Goals Addressed  This Visit's Progress    Patient Stated       08/04/2021, wants to weigh 170 pounds       Depression Screen    08/04/2021   10:37 AM 07/25/2020    8:29 AM 06/05/2019    8:55 AM 06/09/2017   10:08 AM  PHQ 2/9 Scores  PHQ - 2 Score 0 0 0 0  PHQ- 9 Score   0     Fall Risk    08/04/2021   10:37 AM 07/25/2020    8:29 AM  Fall Risk   Falls in the past year? 0   Number falls in past yr: 0 0  Injury with Fall? 0 0  Risk for fall due to : Medication side effect No Fall Risks  Follow up Falls evaluation completed;Education provided;Falls prevention discussed Falls evaluation completed    FALL RISK PREVENTION PERTAINING TO THE HOME:  Any stairs in or around the home? Yes  If so, are there any without handrails? No  Home free of loose throw rugs in walkways, pet beds, electrical cords, etc? Yes  Adequate lighting in your home to reduce risk of falls? Yes   ASSISTIVE DEVICES UTILIZED TO PREVENT FALLS:  Life alert? No  Use of a cane, walker or w/c? No  Grab bars in the bathroom? No  Shower chair or bench in shower? No  Elevated toilet seat or a handicapped toilet? Yes   TIMED UP AND GO:  Was the test performed? No .      Cognitive Function:        Immunizations Immunization History  Administered Date(s) Administered    Influenza Split 01/24/2012, 10/24/2012   Influenza,inj,Quad PF,6+ Mos 12/06/2017, 11/23/2018   PFIZER(Purple Top)SARS-COV-2 Vaccination 01/06/2020   Td 10/24/2009, 10/14/2020    TDAP status: Up to date  Flu Vaccine status: Up to date  Pneumococcal vaccine status: Up to date  Covid-19 vaccine status: Completed vaccines  Qualifies for Shingles Vaccine? No   Zostavax completed No   Shingrix Completed?: n/a  Screening Tests Health Maintenance  Topic Date Due   HIV Screening  Never done   COVID-19 Vaccine (2 - Pfizer risk series) 01/27/2020   INFLUENZA VACCINE  09/23/2021   TETANUS/TDAP  10/15/2030   Hepatitis C Screening  Completed   HPV VACCINES  Aged Out    Health Maintenance  Health Maintenance Due  Topic Date Due   HIV Screening  Never done   COVID-19 Vaccine (2 - Pfizer risk series) 01/27/2020    Colorectal cancer screening: No longer required.   Lung Cancer Screening: (Low Dose CT Chest recommended if Age 26-80 years, 30 pack-year currently smoking OR have quit w/in 15years.) does not qualify.   Lung Cancer Screening Referral: no  Additional Screening:  Hepatitis C Screening: does qualify; Completed 05/30/2020  Vision Screening: Recommended annual ophthalmology exams for early detection of glaucoma and other disorders of the eye. Is the patient up to date with their annual eye exam?  No  Who is the provider or what is the name of the office in which the patient attends annual eye exams? none If pt is not established with a provider, would they like to be referred to a provider to establish care? No .   Dental Screening: Recommended annual dental exams for proper oral hygiene  Community Resource Referral / Chronic Care Management: CRR required this visit?  No   CCM required this visit?  No      Plan:  I have personally reviewed and noted the following in the patient's chart:   Medical and social history Use of alcohol, tobacco or illicit drugs   Current medications and supplements including opioid prescriptions. Patient is not currently taking opioid prescriptions. Functional ability and status Nutritional status Physical activity Advanced directives List of other physicians Hospitalizations, surgeries, and ER visits in previous 12 months Vitals Screenings to include cognitive, depression, and falls Referrals and appointments  In addition, I have reviewed and discussed with patient certain preventive protocols, quality metrics, and best practice recommendations. A written personalized care plan for preventive services as well as general preventive health recommendations were provided to patient.     Barb Merino, LPN   [id number]   Nurse Notes: 6 CIT not administered. Patient has autism  Due to this being a virtual visit, the after visit summary with patients personalized plan was offered to patient via mail or my-chart. Patient would like to access on my-chart

## 2021-09-01 ENCOUNTER — Other Ambulatory Visit: Payer: Self-pay | Admitting: Internal Medicine

## 2021-09-02 DIAGNOSIS — L649 Androgenic alopecia, unspecified: Secondary | ICD-10-CM | POA: Diagnosis not present

## 2021-09-25 ENCOUNTER — Other Ambulatory Visit: Payer: Self-pay | Admitting: Internal Medicine

## 2021-11-05 ENCOUNTER — Encounter: Payer: Self-pay | Admitting: Internal Medicine

## 2021-11-05 ENCOUNTER — Ambulatory Visit (INDEPENDENT_AMBULATORY_CARE_PROVIDER_SITE_OTHER): Payer: Medicare Other | Admitting: Internal Medicine

## 2021-11-05 VITALS — BP 110/78 | HR 95 | Temp 98.6°F | Ht 68.75 in | Wt 175.2 lb

## 2021-11-05 DIAGNOSIS — E7800 Pure hypercholesterolemia, unspecified: Secondary | ICD-10-CM

## 2021-11-05 DIAGNOSIS — F840 Autistic disorder: Secondary | ICD-10-CM

## 2021-11-05 DIAGNOSIS — Z0000 Encounter for general adult medical examination without abnormal findings: Secondary | ICD-10-CM

## 2021-11-05 DIAGNOSIS — Z23 Encounter for immunization: Secondary | ICD-10-CM

## 2021-11-05 DIAGNOSIS — Z79899 Other long term (current) drug therapy: Secondary | ICD-10-CM

## 2021-11-05 DIAGNOSIS — R4586 Emotional lability: Secondary | ICD-10-CM

## 2021-11-05 DIAGNOSIS — Z114 Encounter for screening for human immunodeficiency virus [HIV]: Secondary | ICD-10-CM

## 2021-11-05 NOTE — Patient Instructions (Addendum)
Good to see  you today  Your exam is normal. Flu vaccine today   We can update  lab monitoring  medication .  Continue lifestyle intervention healthy eating and exercise .   BP Readings from Last 3 Encounters:  11/05/21 110/78  10/14/20 100/70  06/05/19 114/72   Wt Readings from Last 3 Encounters:  11/05/21 175 lb 3.2 oz (79.5 kg)  08/04/21 173 lb (78.5 kg)  10/14/20 184 lb 12.8 oz (83.8 kg)

## 2021-11-05 NOTE — Progress Notes (Signed)
Chief Complaint  Patient presents with   Annual Exam    HPI: Patient  Brian Mcgrath  31 y.o. comes in today for Preventive Health Care visit  Med check he is seen alone and then converse with his mom.  Is now working 4 day per week .  Likes it  busses table  Sleep ok  has dog   that snores.  Taking low dose finasterid and minoxidil for  mpb and hair is improving   Exercises  every day  Denies hearing vision  gi gu sx  No recent injury Health Maintenance  Topic Date Due   COVID-19 Vaccine (4 - Pfizer risk series) 03/02/2020   TETANUS/TDAP  10/15/2030   INFLUENZA VACCINE  Completed   Hepatitis C Screening  Completed   HIV Screening  Completed   HPV VACCINES  Aged Out   Health Maintenance Review Hh of 3  dog river snores  he helps care   ROS:  REST of 12 system review negative except as per HPI   Past Medical History:  Diagnosis Date   Acne    Allergic rhinitis    Autism    FRACTURE, ARM, RIGHT 07/18/2008   Qualifier: History of  By: Fabian Sharp MD, Neta Mends    Varicella     Past Surgical History:  Procedure Laterality Date   ADENOIDECTOMY     FRACTURE SURGERY     right arm    recircumcised     rock removal from ear  1999   TONSILLECTOMY  2003    Family History  Problem Relation Age of Onset   Diverticulosis Mother    Depression Father     Social History   Socioeconomic History   Marital status: Single    Spouse name: Not on file   Number of children: Not on file   Years of education: Not on file   Highest education level: Not on file  Occupational History   Not on file  Tobacco Use   Smoking status: Never   Smokeless tobacco: Never  Vaping Use   Vaping Use: Never used  Substance and Sexual Activity   Alcohol use: No   Drug use: Never   Sexual activity: Not on file  Other Topics Concern   Not on file  Social History Narrative   Lives at home with parents is working 3 days a week  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[employer]        Older brother      Social Determinants of Health   Financial Resource Strain: Low Risk  (08/04/2021)   Overall Financial Resource Strain (CARDIA)    Difficulty of Paying Living Expenses: Not hard at all  Food Insecurity: No Food Insecurity (08/04/2021)   Hunger Vital Sign    Worried About Running Out of Food in the Last Year: Never true    Ran Out of Food in the Last Year: Never true  Transportation Needs: No Transportation Needs (08/04/2021)   PRAPARE - Administrator, Civil Service (Medical): No    Lack of Transportation (Non-Medical): No  Physical Activity: Sufficiently Active (08/04/2021)   Exercise Vital Sign    Days of Exercise per Week: 6 days    Minutes of Exercise per Session: 60 min  Stress: No Stress Concern Present (08/04/2021)   [employer] of Occupational Health - Occupational Stress Questionnaire    Feeling of Stress : Not at all  Social Connections: Moderately Integrated (  07/25/2020)   Social Connection and Isolation Panel [NHANES]    Frequency of Communication with Friends and Family: Three times a week    Frequency of Social Gatherings with Friends and Family: Three times a week    Attends Religious Services: More than 4 times per year    Active Member of Clubs or Organizations: Yes    Attends Banker Meetings: More than 4 times per year    Marital Status: Never married    Outpatient Medications Prior to Visit  Medication Sig Dispense Refill   adapalene (DIFFERIN) 0.1 % cream APPLY TOPICALLY AT BEDTIME. PEA SIZED AMOUNT TO AFFTECTED AREA AS DIRECTED. 45 g 3   BIOTIN PO Take by mouth. Takes 2 gummies per day     escitalopram (LEXAPRO) 20 MG tablet TAKE 2 TABLETS(40 MG) BY MOUTH EVERY MORNING 180 tablet 2   finasteride (PROSCAR) 5 MG tablet Take 2.5 mg by mouth daily.     guanFACINE (TENEX) 1 MG tablet TAKE 3/4 TABLET BY MOUTH TWICE DAILY 135 tablet 3   minoxidil (LONITEN) 2.5 MG tablet Take 1.25 mg by mouth daily.     MULTIPLE  VITAMIN PO Take 1 tablet by mouth daily.     omeprazole (PRILOSEC) 20 MG capsule Take 1 capsule (20 mg total) by mouth daily. Or as directed 30 capsule 2   valACYclovir (VALTREX) 1000 MG tablet TAKE 1 TABLET(1000 MG) BY MOUTH DAILY 30 tablet 1   No facility-administered medications prior to visit.     EXAM:  BP 110/78 (BP Location: Left Arm, Patient Position: Sitting, Cuff Size: Normal)   Pulse 95   Temp 98.6 F (37 C) (Oral)   Ht 5' 8.75" (1.746 m)   Wt 175 lb 3.2 oz (79.5 kg)   SpO2 95%   BMI 26.06 kg/m   Body mass index is 26.06 kg/m. Wt Readings from Last 3 Encounters:  11/05/21 175 lb 3.2 oz (79.5 kg)  08/04/21 173 lb (78.5 kg)  10/14/20 184 lb 12.8 oz (83.8 kg)    Physical Exam: Vital signs reviewed OIN:OMVE is a well-developed well-nourished alert cooperative    who appearsr stated age in no acute distress.  Verbal monotone  interactive and cooperative  HEENT: normocephalic atraumatic , Eyes: PERRL EOM's full, conjunctiva clear, Nares: paten,t no deformity discharge or tenderness., Ears: no deformity EAC's clear TMs with normal landmarks. Mouth: clear OP, no lesions, edema.  Moist mucous membranes. Dentition in adequate repair. NECK: supple without masses, thyromegaly or bruits. CHEST/PULM:  Clear to auscultation and percussion breath sounds equal no wheeze , rales or rhonchi. No chest wall deformities or tenderness. CV: PMI is nondisplaced, S1 S2 no gallops, murmurs, rubs. Peripheral pulses are full without delay.No JVD .  ABDOMEN: Bowel sounds normal nontender  No guard or rebound, no hepato splenomegal no CVA tenderness.  Extremtities:  No clubbing cyanosis or edema, no acute joint swelling or redness no focal atrophy well healed old scar right forearm  NEURO:  Oriented x3, cranial nerves 3-12 appear to be intact, no obvious focal weakness,gait within normal limits no abnormal reflexes or asymmetrical SKIN: No acute rashes normal turgor, color, no bruising or  petechiae. Vellus type hair on scalp  where balding PSYCH: Oriented, good eye contact,   monotone   speech  typical autistic  LN: no cervical axillary inguinal adenopathy  Lab Results  Component Value Date   WBC 3.9 (L) 06/05/2019   HGB 15.5 06/05/2019   HCT 45.3 06/05/2019   PLT 202.0  06/05/2019   GLUCOSE 83 06/05/2019   CHOL 170 06/05/2019   TRIG 53.0 06/05/2019   HDL 44.50 06/05/2019   LDLDIRECT 102.0 05/23/2014   LDLCALC 114 (H) 06/05/2019   ALT 11 06/05/2019   AST 15 06/05/2019   NA 140 06/05/2019   K 4.5 06/05/2019   CL 101 06/05/2019   CREATININE 0.84 06/05/2019   BUN 13 06/05/2019   CO2 32 06/05/2019   TSH 3.58 06/05/2019    BP Readings from Last 3 Encounters:  11/05/21 110/78  10/14/20 100/70  06/05/19 114/72    Lab plan reviewed with patient Pierre Bali  NF  ASSESSMENT AND PLAN:  Discussed the following assessment and plan:    ICD-10-CM   1. Visit for preventive health examination  123456 Basic metabolic panel    CBC with Differential/Platelet    Hepatic function panel    Lipid panel    TSH    HIV antibody (with reflex)    TSH    Lipid panel    Hepatic function panel    CBC with Differential/Platelet    Basic metabolic panel    2. Medication management  123456 Basic metabolic panel    CBC with Differential/Platelet    Hepatic function panel    Lipid panel    TSH    TSH    Lipid panel    Hepatic function panel    CBC with Differential/Platelet    Basic metabolic panel    3. Autism spectrum disorder  123456 Basic metabolic panel    CBC with Differential/Platelet    Hepatic function panel    Lipid panel    TSH    TSH    Lipid panel    Hepatic function panel    CBC with Differential/Platelet    Basic metabolic panel    4. Mood disturbance stable at this time   [id number] Basic metabolic panel    CBC with Differential/Platelet    Hepatic function panel    Lipid panel    TSH    TSH    Lipid panel    Hepatic function panel    CBC with  Differential/Platelet    Basic metabolic panel    5. Elevated LDL cholesterol level  [id number] Basic metabolic panel    CBC with Differential/Platelet    Hepatic function panel    Lipid panel    TSH    TSH    Lipid panel    Hepatic function panel    CBC with Differential/Platelet    Basic metabolic panel    mild  follow    6. Screening for HIV (human immunodeficiency virus)  Z11.4 HIV antibody (with reflex)    7. Influenza vaccine needed  Z23 Flu Vaccine QUAD 6+ mos PF IM (Fluarix Quad PF)    Stable  doing  healthy lifestyle  at this time  lab monitoring  If all ok flu vaccine today and yearly check   Return for depending on results 6- 12 months.  Patient Care Team: Burnis Medin, MD as PCP - General Patient Instructions  Good to see  you today  Your exam is normal. Flu vaccine today   We can update  lab monitoring  medication .  Continue lifestyle intervention healthy eating and exercise .   BP Readings from Last 3 Encounters:  11/05/21 110/78  10/14/20 100/70  06/05/19 114/72   Wt Readings from Last 3 Encounters:  11/05/21 175 lb 3.2 oz (79.5 kg)  08/04/21 173 lb (78.5 kg)  10/14/20 184 lb 12.8 oz (83.8 kg)     Che Below K. Ruven Corradi M.D.

## 2021-11-06 LAB — BASIC METABOLIC PANEL
BUN: 17 mg/dL (ref 6–23)
CO2: 30 mEq/L (ref 19–32)
Calcium: 9.5 mg/dL (ref 8.4–10.5)
Chloride: 101 mEq/L (ref 96–112)
Creatinine, Ser: 0.94 mg/dL (ref 0.40–1.50)
GFR: 107.48 mL/min (ref >60.00)
Glucose, Bld: 84 mg/dL (ref 70–99)
Potassium: 3.9 mEq/L (ref 3.5–5.1)
Sodium: 138 mEq/L (ref 135–145)

## 2021-11-06 LAB — HEPATIC FUNCTION PANEL
ALT: 18 U/L (ref 0–53)
AST: 23 U/L (ref 0–37)
Albumin: 4.5 g/dL (ref 3.5–5.2)
Alkaline Phosphatase: 61 U/L (ref 39–117)
Bilirubin, Direct: 0.1 mg/dL (ref 0.0–0.3)
Total Bilirubin: 0.6 mg/dL (ref 0.2–1.2)
Total Protein: 7.1 g/dL (ref 6.0–8.3)

## 2021-11-06 LAB — LIPID PANEL
Cholesterol: 168 mg/dL (ref 0–200)
HDL: 60.6 mg/dL (ref >39.00)
LDL Cholesterol: 95 mg/dL (ref 0–99)
NonHDL: 107.54
Total CHOL/HDL Ratio: 3
Triglycerides: 65 mg/dL (ref 0.0–149.0)
VLDL: 13 mg/dL (ref 0.0–40.0)

## 2021-11-06 LAB — HIV ANTIBODY (ROUTINE TESTING W REFLEX): HIV 1&2 Ab, 4th Generation: NONREACTIVE

## 2021-11-06 LAB — CBC WITH DIFFERENTIAL/PLATELET
Basophils Absolute: 0 10*3/uL (ref 0.0–0.1)
Basophils Relative: 0.4 % (ref 0.0–3.0)
Eosinophils Absolute: 0 10*3/uL (ref 0.0–0.7)
Eosinophils Relative: 0.6 % (ref 0.0–5.0)
HCT: 43.5 % (ref 39.0–52.0)
Hemoglobin: 14.7 g/dL (ref 13.0–17.0)
Lymphocytes Relative: 26.9 % (ref 12.0–46.0)
Lymphs Abs: 1.5 10*3/uL (ref 0.7–4.0)
MCHC: 33.7 g/dL (ref 30.0–36.0)
MCV: 92.8 fl (ref 78.0–100.0)
Monocytes Absolute: 0.6 10*3/uL (ref 0.1–1.0)
Monocytes Relative: 10.2 % (ref 3.0–12.0)
Neutro Abs: 3.5 10*3/uL (ref 1.4–7.7)
Neutrophils Relative %: 61.9 % (ref 43.0–77.0)
Platelets: 223 10*3/uL (ref 150.0–400.0)
RBC: 4.69 Mil/uL (ref 4.22–5.81)
RDW: 13.1 % (ref 11.5–15.5)
WBC: 5.6 10*3/uL (ref 4.0–10.5)

## 2021-11-06 LAB — TSH: TSH: 1.7 u[IU]/mL (ref 0.35–5.50)

## 2021-11-09 NOTE — Progress Notes (Signed)
Blood results are all in range    very good.  Continue lifestyle intervention healthy eating and exercise .

## 2021-12-13 ENCOUNTER — Other Ambulatory Visit: Payer: Self-pay | Admitting: Internal Medicine

## 2021-12-14 ENCOUNTER — Other Ambulatory Visit: Payer: Self-pay | Admitting: Internal Medicine

## 2022-02-09 ENCOUNTER — Other Ambulatory Visit: Payer: Self-pay | Admitting: Internal Medicine

## 2022-03-25 ENCOUNTER — Other Ambulatory Visit: Payer: Self-pay | Admitting: Internal Medicine

## 2022-04-23 ENCOUNTER — Other Ambulatory Visit: Payer: Self-pay | Admitting: Internal Medicine

## 2022-06-08 ENCOUNTER — Other Ambulatory Visit: Payer: Self-pay | Admitting: Internal Medicine

## 2022-07-28 ENCOUNTER — Ambulatory Visit (INDEPENDENT_AMBULATORY_CARE_PROVIDER_SITE_OTHER): Payer: 59 | Admitting: Family Medicine

## 2022-07-28 DIAGNOSIS — Z0000 Encounter for general adult medical examination without abnormal findings: Secondary | ICD-10-CM | POA: Diagnosis not present

## 2022-07-28 NOTE — Progress Notes (Signed)
PATIENT CHECK-IN and HEALTH RISK ASSESSMENT QUESTIONNAIRE:  -completed by phone/video for upcoming Medicare Preventive Visit  Pre-Visit Check-in: 1)Vitals (height, wt, BP, etc) - record in vitals section for visit on day of visit 2)Review and Update Medications, Allergies PMH, Surgeries, Social history in Epic 3)Hospitalizations in the last year with date/reason?  No   4)Review and Update Care Team (patient's specialists) in Epic 5) Complete PHQ9 in Epic  6) Complete Fall Screening in Epic 7)Review all Health Maintenance Due and order under PCP if not done.  Medicare Wellness Patient Questionnaire:  Answer theses question about your habits: Do you drink alcohol?  No  If yes, how many drinks do you have a day? No  Have you ever smoked? No  Quit date if applicable?  N/a  How many packs a day do/did you smoke?  N/a Do you use smokeless tobacco?  no Do you use an illicit drugs? N/a  Do you exercises?  Yes, daily - weights, push ups, pull ups, walks 5 miles Are you sexually active? No Number of partners? N/a  Typical breakfast: eggs over easy and almond butter  Typical lunch rice platter with chicken and humus. When not working it is leftovers  Typical dinner meat and 1 veg Typical snacks: peanut butter bar, carrots, apples, baby bell cheese  Beverages: water, coffee  Answer theses question about you: Can you perform most household chores? yes  Do you find it hard to follow a conversation in a noisy room? No  Do you often ask people to speak up or repeat themselves? Yes, habit or not focused   Do you feel that you have a problem with memory?no  Do you balance your checkbook and or bank acounts?  No  Do you feel safe at home? yes  Last dentist visit? April  Do you need assistance with any of the following: Please note if so no   Driving?  Feeding yourself?  Getting from bed to chair?  Getting to the toilet?  Bathing or showering?  Dressing yourself?  Managing money?  Climbing a  flight of stairs  Preparing meals?    Do you have Advanced Directives in place (Living Will, Healthcare Power or Attorney)? Yes    Last eye Exam and location? Has been a while    Do you currently use prescribed or non-prescribed narcotic or opioid pain medications? No   Do you have a history or close family history of breast, ovarian, tubal or peritoneal cancer or a family member with BRCA (breast cancer susceptibility 1 and 2) gene mutations? No   Nurse/Assistant Credentials/time stamp: Tora Perches -CMA 4:15 pm   ----------------------------------------------------------------------------------------------------------------------------------------------------------------------------------------------------------------------    MEDICARE ANNUAL PREVENTIVE CARE VISIT WITH PROVIDER (Welcome to Medicare, initial annual wellness or annual wellness exam)  Virtual Visit via Phone Note  I connected with Barbera Setters on 07/28/22  by phone and verified that I am speaking with the correct person using two identifiers.  Location patient: home Location provider:work or home office Persons participating in the virtual visit: patient, provider, mother  Concerns and/or follow up today: stable   See HM section in Epic for other details of completed HM.    ROS: negative for report of fevers, unintentional weight loss, vision changes, vision loss, hearing loss or change, chest pain, sob, hemoptysis, melena, hematochezia, hematuria, falls, bleeding or bruising, thoughts of suicide or self harm, memory loss  Patient-completed extensive health risk assessment - reviewed and discussed with the patient: See Health Risk Assessment completed with  patient prior to the visit either above or in recent phone note. This was reviewed in detailed with the patient today and appropriate recommendations, orders and referrals were placed as needed per Summary below and patient instructions.   Review of Medical  History: -PMH, PSH, Family History and current specialty and care providers reviewed and updated and listed below   Patient Care Team: Panosh, Neta Mends, MD as PCP - General   Past Medical History:  Diagnosis Date   Acne    Allergic rhinitis    Autism    FRACTURE, ARM, RIGHT 07/18/2008   Qualifier: History of  By: Fabian Sharp MD, Neta Mends    Varicella     Past Surgical History:  Procedure Laterality Date   ADENOIDECTOMY     FRACTURE SURGERY     right arm    recircumcised     rock removal from ear  1999   TONSILLECTOMY  2003    Social History   Socioeconomic History   Marital status: Single    Spouse name: Not on file   Number of children: Not on file   Years of education: Not on file   Highest education level: Not on file  Occupational History   Not on file  Tobacco Use   Smoking status: Never   Smokeless tobacco: Never  Vaping Use   Vaping Use: Never used  Substance and Sexual Activity   Alcohol use: No   Drug use: Never   Sexual activity: Not on file  Other Topics Concern   Not on file  Social History Narrative   Lives at home with parents is working 3 days a week  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[employer]       Older brother      Social Determinants of Health   Financial Resource Strain: Low Risk  (08/04/2021)   Overall Financial Resource Strain (CARDIA)    Difficulty of Paying Living Expenses: Not hard at all  Food Insecurity: No Food Insecurity (08/04/2021)   Hunger Vital Sign    Worried About Running Out of Food in the Last Year: Never true    Ran Out of Food in the Last Year: Never true  Transportation Needs: No Transportation Needs (08/04/2021)   PRAPARE - Administrator, Civil Service (Medical): No    Lack of Transportation (Non-Medical): No  Physical Activity: Sufficiently Active (08/04/2021)   Exercise Vital Sign    Days of Exercise per Week: 6 days    Minutes of Exercise per Session: 60 min  Stress: No Stress Concern  Present (08/04/2021)   [employer] of Occupational Health - Occupational Stress Questionnaire    Feeling of Stress : Not at all  Social Connections: Moderately Integrated (07/25/2020)   Social Connection and Isolation Panel [NHANES]    Frequency of Communication with Friends and Family: Three times a week    Frequency of Social Gatherings with Friends and Family: Three times a week    Attends Religious Services: More than 4 times per year    Active Member of Clubs or Organizations: Yes    Attends Banker Meetings: More than 4 times per year    Marital Status: Never married  Intimate Partner Violence: Not At Risk (07/25/2020)   Humiliation, Afraid, Rape, and Kick questionnaire    Fear of Current or Ex-Partner: No    Emotionally Abused: No    Physically Abused: No    Sexually Abused: No  Family History  Problem Relation Age of Onset   Diverticulosis Mother    Depression Father     Current Outpatient Medications on File Prior to Visit  Medication Sig Dispense Refill   BIOTIN PO Take by mouth. Takes 2 gummies per day     escitalopram (LEXAPRO) 20 MG tablet TAKE 2 TABLETS(40 MG) BY MOUTH EVERY MORNING 180 tablet 2   finasteride (PROSCAR) 5 MG tablet Take 2.5 mg by mouth daily.     guanFACINE (TENEX) 1 MG tablet TAKE 3/4 TABLET BY MOUTH TWICE DAILY 135 tablet 3   minoxidil (LONITEN) 2.5 MG tablet Take 1.25 mg by mouth daily.     MULTIPLE VITAMIN PO Take 1 tablet by mouth daily.     omeprazole (PRILOSEC) 20 MG capsule Take 1 capsule (20 mg total) by mouth daily. Or as directed 30 capsule 2   valACYclovir (VALTREX) 1000 MG tablet TAKE 1 TABLET(1000 MG) BY MOUTH DAILY 30 tablet 1   adapalene (DIFFERIN) 0.1 % cream APPLY TOPICALLY AT BEDTIME. PEA SIZED AMOUNT TO AFFTECTED AREA AS DIRECTED. (Patient not taking: Reported on 07/28/2022) 45 g 3   No current facility-administered medications on file prior to visit.    No Known Allergies     Physical Exam There were no  vitals filed for this visit. Estimated body mass index is 26.06 kg/m as calculated from the following:   Height as of 11/05/21: 5' 8.75" (1.746 m).   Weight as of 11/05/21: 175 lb 3.2 oz (79.5 kg).  EKG (optional): deferred due to virtual visit  GENERAL: alert, oriented, no acute distress detected; full vision exam deferred due to pandemic and/or virtual encounter PSYCH/NEURO: pleasant and cooperative, no obvious depression or anxiety, speech and thought processing grossly intact, Cognitive function grossly intact  Flowsheet Row Office Visit from 11/05/2021 in [facility] [place]  PHQ-9 Total Score 0           07/28/2022    4:25 PM 11/05/2021    2:38 PM 08/04/2021   10:37 AM 07/25/2020    8:29 AM 06/05/2019    8:55 AM  Depression screen PHQ 2/9  Decreased Interest 0 0 0 0 0  Down, Depressed, Hopeless 0 0 0 0 0  PHQ - 2 Score 0 0 0 0 0  Altered sleeping  0   0  Tired, decreased energy  0   0  Change in appetite  0   0  Feeling bad or failure about yourself   0   0  Trouble concentrating  0   0  Moving slowly or fidgety/restless  0   0  Suicidal thoughts  0   0  PHQ-9 Score  0   0  Difficult doing work/chores     Not difficult at all       07/25/2020    8:29 AM 08/04/2021   10:37 AM 11/05/2021    2:38 PM 07/28/2022    4:06 PM 07/28/2022    4:25 PM  Fall Risk  Falls in the past year?  0 0 0 0  Was there an injury with Fall? 0 0 0 0 0  Fall Risk Category Calculator  0 0 0 0  Fall Risk Category (Retired)  Low Low    (RETIRED) Patient Fall Risk Level Low fall risk Low fall risk Low fall risk    Patient at Risk for Falls Due to No Fall Risks Medication side effect No Fall Risks No Fall Risks   Fall  risk Follow up Falls evaluation completed Falls evaluation completed;Education provided;Falls prevention discussed Falls evaluation completed Falls evaluation completed      SUMMARY AND PLAN:  Encounter for Medicare annual wellness exam    Discussed applicable  health maintenance/preventive health measures and advised and referred or ordered per patient preferences: -discussed covid booster recs per cdc  Health Maintenance  Topic Date Due   COVID-19 Vaccine (4 - 2023-24 season) 10/24/2021   INFLUENZA VACCINE  09/24/2022   Medicare Annual Wellness (AWV)  07/28/2023   DTaP/Tdap/Td (3 - Tdap) 10/15/2030   Hepatitis C Screening  Completed   HIV Screening  Completed   HPV VACCINES  Aged [employer] and counseling on the following was provided based on the above review of health and a plan/checklist for the patient, along with additional information discussed, was provided for the patient in the patient instructions :    -Advised and counseled on a healthy lifestyle - including the importance of a healthy diet, regular physical activity, social connections and stress management. -Reviewed patient's current diet. Advised and counseled on a whole foods based healthy diet. A summary of a healthy diet was provided in the Patient Instructions.  -reviewed patient's current physical activity level and discussed exercise guidelines for adults. Discussed community resources and ideas for safe exercise at home to assist in meeting exercise guideline recommendations in a safe and healthy way.  -Advise yearly dental visits at minimum and regular eye exams   Follow up: see patient instructions   Patient Instructions  I really enjoyed getting to talk with you today! I am available on Tuesdays and Thursdays for virtual visits if you have any questions or concerns, or if I can be of any further assistance.   CHECKLIST FROM ANNUAL WELLNESS VISIT:  -Follow up (please call to schedule if not scheduled after visit):   -yearly for annual wellness visit with primary care office  Here is a list of your preventive care/health maintenance measures and the plan for each if any are due:  PLAN For any measures below that may be due:   Health Maintenance  Topic  Date Due   COVID-19 Vaccine (4 - 2023-24 season) 10/24/2021 - can get at the pharmacy   INFLUENZA VACCINE  09/24/2022   Medicare Annual Wellness (AWV)  07/28/2023   DTaP/Tdap/Td (3 - Tdap) 10/15/2030   Hepatitis C Screening  Completed   HIV Screening  Completed   HPV VACCINES  Aged Out    -See a dentist at least yearly  -Get your eyes checked and then per your eye specialist's recommendations  -Other issues addressed today:   -I have included below further information regarding a healthy whole foods based diet, physical activity guidelines for adults, stress management and opportunities for social connections. I hope you find this information useful.   -----------------------------------------------------------------------------------------------------------------------------------------------------------------------------------------------------------------------------------------------------------  NUTRITION: -eat real food: lots of colorful vegetables (half the plate) and fruits -5-7 servings of vegetables and fruits per day (fresh or steamed is best), exp. 2 servings of vegetables with lunch and dinner and 2 servings of fruit per day. Berries and greens such as kale and collards are great choices.  -consume on a regular basis: whole grains (make sure first ingredient on label contains the word "whole"), fresh fruits, fish, nuts, seeds, healthy oils (such as olive oil, avocado oil, grape seed oil) -may eat small amounts of dairy and lean meat on occasion, but avoid processed meats such as ham, bacon, lunch meat, etc. -drink water -  try to avoid fast food and pre-packaged foods, processed meat -most experts advise limiting sodium to < 2300mg  per day, should limit further is any chronic conditions such as high blood pressure, heart disease, diabetes, etc. The American Heart Association advised that < 1500mg  is is ideal -try to avoid foods that contain any ingredients with names you do  not recognize  -try to avoid sugar/sweets (except for the natural sugar that occurs in fresh fruit) -try to avoid sweet drinks -try to avoid white rice, white bread, pasta (unless whole grain), white or yellow potatoes  EXERCISE GUIDELINES FOR ADULTS: -if you wish to increase your physical activity, do so gradually and with the approval of your doctor -STOP and seek medical care immediately if you have any chest pain, chest discomfort or trouble breathing when starting or increasing exercise  -move and stretch your body, legs, feet and arms when sitting for long periods -Physical activity guidelines for optimal health in adults: -least 150 minutes per week of aerobic exercise (can talk, but not sing) once approved by your doctor, 20-30 minutes of sustained activity or two 10 minute episodes of sustained activity every day.  -resistance training at least 2 days per week if approved by your doctor -balance exercises 3+ days per week:   Stand somewhere where you have something sturdy to hold onto if you lose balance.    1) lift up on toes, start with 5x per day and work up to 20x   2) stand and lift on leg straight out to the side so that foot is a few inches of the floor, start with 5x each side and work up to 20x each side   3) stand on one foot, start with 5 seconds each side and work up to 20 seconds on each side          Terressa Koyanagi, DO

## 2022-07-28 NOTE — Patient Instructions (Signed)
I really enjoyed getting to talk with you today! I am available on Tuesdays and Thursdays for virtual visits if you have any questions or concerns, or if I can be of any further assistance.   CHECKLIST FROM ANNUAL WELLNESS VISIT:  -Follow up (please call to schedule if not scheduled after visit):   -yearly for annual wellness visit with primary care office  Here is a list of your preventive care/health maintenance measures and the plan for each if any are due:  PLAN For any measures below that may be due:   Health Maintenance  Topic Date Due   COVID-19 Vaccine (4 - 2023-24 season) 10/24/2021 - can get at the pharmacy   INFLUENZA VACCINE  09/24/2022   Medicare Annual Wellness (AWV)  07/28/2023   DTaP/Tdap/Td (3 - Tdap) 10/15/2030   Hepatitis C Screening  Completed   HIV Screening  Completed   HPV VACCINES  Aged Out    -See a dentist at least yearly  -Get your eyes checked and then per your eye specialist's recommendations  -Other issues addressed today:   -I have included below further information regarding a healthy whole foods based diet, physical activity guidelines for adults, stress management and opportunities for social connections. I hope you find this information useful.   -----------------------------------------------------------------------------------------------------------------------------------------------------------------------------------------------------------------------------------------------------------  NUTRITION: -eat real food: lots of colorful vegetables (half the plate) and fruits -5-7 servings of vegetables and fruits per day (fresh or steamed is best), exp. 2 servings of vegetables with lunch and dinner and 2 servings of fruit per day. Berries and greens such as kale and collards are great choices.  -consume on a regular basis: whole grains (make sure first ingredient on label contains the word "whole"), fresh fruits, fish, nuts, seeds, healthy  oils (such as olive oil, avocado oil, grape seed oil) -may eat small amounts of dairy and lean meat on occasion, but avoid processed meats such as ham, bacon, lunch meat, etc. -drink water -try to avoid fast food and pre-packaged foods, processed meat -most experts advise limiting sodium to < 2300mg  per day, should limit further is any chronic conditions such as high blood pressure, heart disease, diabetes, etc. The American Heart Association advised that < 1500mg  is is ideal -try to avoid foods that contain any ingredients with names you do not recognize  -try to avoid sugar/sweets (except for the natural sugar that occurs in fresh fruit) -try to avoid sweet drinks -try to avoid white rice, white bread, pasta (unless whole grain), white or yellow potatoes  EXERCISE GUIDELINES FOR ADULTS: -if you wish to increase your physical activity, do so gradually and with the approval of your doctor -STOP and seek medical care immediately if you have any chest pain, chest discomfort or trouble breathing when starting or increasing exercise  -move and stretch your body, legs, feet and arms when sitting for long periods -Physical activity guidelines for optimal health in adults: -least 150 minutes per week of aerobic exercise (can talk, but not sing) once approved by your doctor, 20-30 minutes of sustained activity or two 10 minute episodes of sustained activity every day.  -resistance training at least 2 days per week if approved by your doctor -balance exercises 3+ days per week:   Stand somewhere where you have something sturdy to hold onto if you lose balance.    1) lift up on toes, start with 5x per day and work up to 20x   2) stand and lift on leg straight out to the side so  that foot is a few inches of the floor, start with 5x each side and work up to 20x each side   3) stand on one foot, start with 5 seconds each side and work up to 20 seconds on each side

## 2022-08-29 ENCOUNTER — Other Ambulatory Visit: Payer: Self-pay | Admitting: Family

## 2022-08-31 NOTE — Telephone Encounter (Signed)
Prescription Request  08/31/2022  LOV: Visit date not found  What is the name of the medication or equipment?  valACYclovir valACYclovir (VALTREX) 1000 MG tablet  Pt is officially out of this medication.   Mother was informed MD is OOO on Mondays.  Please send refill asap.  Have you contacted your pharmacy to request a refill? No   Which pharmacy would you like this sent to?   [id number] - [place], [place] - [address] AT [address] [place] [address] Phone: [phone] Fax: [phone]  Patient notified that their request is being sent to the clinical staff for review and that they should receive a response within 2 business days.   Please advise at Mobile [phone] (mobile)

## 2022-10-08 ENCOUNTER — Encounter (INDEPENDENT_AMBULATORY_CARE_PROVIDER_SITE_OTHER): Payer: Self-pay

## 2022-10-15 ENCOUNTER — Telehealth: Payer: Self-pay

## 2022-10-15 ENCOUNTER — Other Ambulatory Visit: Payer: Self-pay

## 2022-10-15 NOTE — Telephone Encounter (Signed)
*  Primary  Pharmacy Patient Advocate Encounter   Received notification from Fax that prior authorization for guanFACINE HCl 1MG  tablets  is required/requested.   Insurance verification completed.   The patient is insured through [facility] .   Per test claim: PA required; PA submitted to [facility] via CoverMyMeds Key/confirmation #/EOC [id number] Status is pending

## 2022-10-23 NOTE — Telephone Encounter (Signed)
Pharmacy Patient Advocate Encounter  Received notification from [facility] that Prior Authorization for guanFACINE HCl 1MG  tablets  has been DENIED.  See denial reason below. No denial letter attached in CMM. Will attache denial letter to Media tab once received.   PA #/Case ID/Reference #: Medicare allows [place] to cover a drug only when it is a Part D drug. A Part D drug is one that is used for a "medically accepted indication." A medically accepted indication means the use is approved by the Food and Drug Administration (FDA) OR the use is supported by one of the following accepted references: (1) [facility] Formulary Service Drug Information. (2) [employer] DRUGDEX Information System. GUANFACINE TAB 1MG  is not FDA approved for your medical condition(s): Mood disturbance and Autism Spectrum Disorder. These condition(s) are not supported by one of the accepted references. Therefore your drug is denied because it is not being used for a "medically accepted indication."

## 2022-10-29 NOTE — Telephone Encounter (Signed)
So this is indicated for his add adhd symptoms  associated with autism spectrum  and has been using in past  well.  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

## 2022-11-04 NOTE — Telephone Encounter (Signed)
Tried to resubmit PA via CMM. PA was cancelled by insurance. Appeal must be submitted.   Please be advised we currently do not have a Pharmacist to review denials, therefore you will need to process appeals accordingly as needed. Thanks for your support at this time. Contact for appeals are as follows: Phone: [phone], Fax: [phone]

## 2022-11-17 NOTE — Progress Notes (Unsigned)
No chief complaint on file.   HPI: Patient  Brian Mcgrath  32 y.o. comes in today for Preventive Health Care visit   Health Maintenance  Topic Date Due   INFLUENZA VACCINE  09/24/2022   COVID-19 Vaccine (4 - 2023-24 season) 10/25/2022   Medicare Annual Wellness (AWV)  07/28/2023   DTaP/Tdap/Td (3 - Tdap) 10/15/2030   Hepatitis C Screening  Completed   HIV Screening  Completed   HPV VACCINES  Aged Out   Health Maintenance Review LIFESTYLE:  Exercise:   Tobacco/ETS:n Alcohol:  Sugar beverages: Sleep: Drug use: no HH of 3 Work:    ROS:  GEN/ HEENT: No fever, significant weight changes sweats headaches vision problems hearing changes, CV/ PULM; No chest pain shortness of breath cough, syncope,edema  change in exercise tolerance. GI /GU: No adominal pain, vomiting, change in bowel habits. No blood in the stool. No significant GU symptoms. SKIN/HEME: ,no acute skin rashes suspicious lesions or bleeding. No lymphadenopathy, nodules, masses.  NEURO/ PSYCH:  No neurologic signs such as weakness numbness. No depression anxiety. IMM/ Allergy: No unusual infections.  Allergy .   REST of 12 system review negative except as per HPI   Past Medical History:  Diagnosis Date   Acne    Allergic rhinitis    Autism    FRACTURE, ARM, RIGHT 07/18/2008   Qualifier: History of  By: Fabian Sharp MD, Neta Mends    Varicella     Past Surgical History:  Procedure Laterality Date   ADENOIDECTOMY     FRACTURE SURGERY     right arm    recircumcised     rock removal from ear  1999   TONSILLECTOMY  2003    Family History  Problem Relation Age of Onset   Diverticulosis Mother    Depression Father     Social History   Socioeconomic History   Marital status: Single    Spouse name: Not on file   Number of children: Not on file   Years of education: Not on file   Highest education level: Not on file  Occupational History   Not on file  Tobacco Use   Smoking status: Never   Smokeless  tobacco: Never  Vaping Use   Vaping status: Never Used  Substance and Sexual Activity   Alcohol use: No   Drug use: Never   Sexual activity: Not on file  Other Topics Concern   Not on file  Social History Narrative   Lives at home with parents is working 3 days a week  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[employer]       Older brother      Social Determinants of Health   Financial Resource Strain: Low Risk  (08/04/2021)   Overall Financial Resource Strain (CARDIA)    Difficulty of Paying Living Expenses: Not hard at all  Food Insecurity: No Food Insecurity (08/04/2021)   Hunger Vital Sign    Worried About Running Out of Food in the Last Year: Never true    Ran Out of Food in the Last Year: Never true  Transportation Needs: No Transportation Needs (08/04/2021)   PRAPARE - Administrator, Civil Service (Medical): No    Lack of Transportation (Non-Medical): No  Physical Activity: Sufficiently Active (08/04/2021)   Exercise Vital Sign    Days of Exercise per Week: 6 days    Minutes of Exercise per Session: 60 min  Stress: No Stress Concern Present (  08/04/2021)   [place] [employer] - Occupational Stress Questionnaire    Feeling of Stress : Not at all  Social Connections: Moderately Integrated (07/25/2020)   Social Connection and Isolation Panel [NHANES]    Frequency of Communication with Friends and Family: Three times a week    Frequency of Social Gatherings with Friends and Family: Three times a week    Attends Religious Services: More than 4 times per year    Active Member of Clubs or Organizations: Yes    Attends Banker Meetings: More than 4 times per year    Marital Status: Never married    Outpatient Medications Prior to Visit  Medication Sig Dispense Refill   adapalene (DIFFERIN) 0.1 % cream APPLY TOPICALLY AT BEDTIME. PEA SIZED AMOUNT TO AFFTECTED AREA AS DIRECTED. (Patient not taking: Reported on 07/28/2022) 45 g 3    BIOTIN PO Take by mouth. Takes 2 gummies per day     escitalopram (LEXAPRO) 20 MG tablet TAKE 2 TABLETS(40 MG) BY MOUTH EVERY MORNING 180 tablet 2   finasteride (PROSCAR) 5 MG tablet Take 2.5 mg by mouth daily.     guanFACINE (TENEX) 1 MG tablet TAKE 3/4 TABLET BY MOUTH TWICE DAILY 135 tablet 3   minoxidil (LONITEN) 2.5 MG tablet Take 1.25 mg by mouth daily.     MULTIPLE VITAMIN PO Take 1 tablet by mouth daily.     omeprazole (PRILOSEC) 20 MG capsule Take 1 capsule (20 mg total) by mouth daily. Or as directed 30 capsule 2   valACYclovir (VALTREX) 1000 MG tablet TAKE 1 TABLET(1000 MG) BY MOUTH DAILY 90 tablet 2   No facility-administered medications prior to visit.     EXAM:  There were no vitals taken for this visit.  There is no height or weight on file to calculate BMI. Wt Readings from Last 3 Encounters:  11/05/21 175 lb 3.2 oz (79.5 kg)  08/04/21 173 lb (78.5 kg)  10/14/20 184 lb 12.8 oz (83.8 kg)    Physical Exam: Vital signs reviewed UJW:[id number] is a well-developed well-nourished alert cooperative    who appearsr stated age in no acute distress.  HEENT: normocephalic atraumatic , Eyes: PERRL EOM's full, conjunctiva clear, Nares: paten,t no deformity discharge or tenderness., Ears: no deformity EAC's clear TMs with normal landmarks. Mouth: clear OP, no lesions, edema.  Moist mucous membranes. Dentition in adequate repair. NECK: supple without masses, thyromegaly or bruits. CHEST/PULM:  Clear to auscultation and percussion breath sounds equal no wheeze , rales or rhonchi. No chest wall deformities or tenderness. Breast: normal by inspection . No dimpling, discharge, masses, tenderness or discharge . CV: PMI is nondisplaced, S1 S2 no gallops, murmurs, rubs. Peripheral pulses are full without delay.No JVD .  ABDOMEN: Bowel sounds normal nontender  No guard or rebound, no hepato splenomegal no CVA tenderness.  No hernia. Extremtities:  No clubbing cyanosis or edema, no acute joint  swelling or redness no focal atrophy NEURO:  Oriented x3, cranial nerves 3-12 appear to be intact, no obvious focal weakness,gait within normal limits no abnormal reflexes or asymmetrical SKIN: No acute rashes normal turgor, color, no bruising or petechiae. PSYCH: Oriented, good eye contact, no obvious depression anxiety, cognition and judgment appear normal. LN: no cervical axillary inguinal adenopathy  Lab Results  Component Value Date   WBC 5.6 11/05/2021   HGB 14.7 11/05/2021   HCT 43.5 11/05/2021   PLT 223.0 11/05/2021   GLUCOSE 84 11/05/2021   CHOL 168 11/05/2021  TRIG 65.0 11/05/2021   HDL 60.60 11/05/2021   LDLDIRECT 102.0 05/23/2014   LDLCALC 95 11/05/2021   ALT 18 11/05/2021   AST 23 11/05/2021   NA 138 11/05/2021   K 3.9 11/05/2021   CL 101 11/05/2021   CREATININE 0.94 11/05/2021   BUN 17 11/05/2021   CO2 30 11/05/2021   TSH 1.70 11/05/2021    BP Readings from Last 3 Encounters:  11/05/21 110/78  10/14/20 100/70  06/05/19 114/72    Lab results reviewed with patient   ASSESSMENT AND PLAN:  Discussed the following assessment and plan:    ICD-10-CM   1. Visit for preventive health examination  Z00.00     2. Autistic disorder  F84.0     3. Mood disturbance  R45.86     4. Adjustment disorder with anxiety  F43.22     5. Medication management  Z79.899     6. Autism spectrum disorder  F84.0      No follow-ups on file.  Patient Care Team: Madelin Headings, MD as PCP - General There are no Patient Instructions on file for this visit.  Neta Mends. Bunnie Lederman M.D.

## 2022-11-18 ENCOUNTER — Ambulatory Visit (INDEPENDENT_AMBULATORY_CARE_PROVIDER_SITE_OTHER): Payer: 59 | Admitting: Internal Medicine

## 2022-11-18 ENCOUNTER — Encounter: Payer: Self-pay | Admitting: Internal Medicine

## 2022-11-18 VITALS — BP 126/80 | HR 88 | Temp 97.5°F | Ht 69.0 in | Wt 181.8 lb

## 2022-11-18 DIAGNOSIS — Z23 Encounter for immunization: Secondary | ICD-10-CM

## 2022-11-18 DIAGNOSIS — Z79899 Other long term (current) drug therapy: Secondary | ICD-10-CM

## 2022-11-18 DIAGNOSIS — F840 Autistic disorder: Secondary | ICD-10-CM

## 2022-11-18 DIAGNOSIS — F4322 Adjustment disorder with anxiety: Secondary | ICD-10-CM

## 2022-11-18 DIAGNOSIS — Z0000 Encounter for general adult medical examination without abnormal findings: Secondary | ICD-10-CM | POA: Diagnosis not present

## 2022-11-18 DIAGNOSIS — Z808 Family history of malignant neoplasm of other organs or systems: Secondary | ICD-10-CM

## 2022-11-18 DIAGNOSIS — R4586 Emotional lability: Secondary | ICD-10-CM

## 2022-11-18 DIAGNOSIS — E7800 Pure hypercholesterolemia, unspecified: Secondary | ICD-10-CM

## 2022-11-18 MED ORDER — OMEPRAZOLE 20 MG PO CPDR: 20.0000 mg | DELAYED_RELEASE_CAPSULE | Freq: Every day | ORAL | 2 refills | Status: AC

## 2022-11-18 MED ORDER — ESCITALOPRAM OXALATE 20 MG PO TABS: 40.0000 mg | ORAL_TABLET | Freq: Every day | ORAL | 2 refills | Status: DC

## 2022-11-18 MED ORDER — GUANFACINE HCL 1 MG PO TABS: ORAL_TABLET | ORAL | 3 refills | Status: AC

## 2022-11-18 NOTE — Patient Instructions (Signed)
Good to see you today  Exam is normal. Continue lifestyle intervention healthy eating and exercise .  Can get fasting lab when convenient  at [address] lab when open reg days no appt needed .

## 2022-11-30 ENCOUNTER — Other Ambulatory Visit (INDEPENDENT_AMBULATORY_CARE_PROVIDER_SITE_OTHER): Payer: 59

## 2022-11-30 DIAGNOSIS — Z79899 Other long term (current) drug therapy: Secondary | ICD-10-CM | POA: Diagnosis not present

## 2022-11-30 DIAGNOSIS — E7800 Pure hypercholesterolemia, unspecified: Secondary | ICD-10-CM

## 2022-11-30 DIAGNOSIS — F840 Autistic disorder: Secondary | ICD-10-CM | POA: Diagnosis not present

## 2022-11-30 DIAGNOSIS — R4586 Emotional lability: Secondary | ICD-10-CM

## 2022-11-30 DIAGNOSIS — F4322 Adjustment disorder with anxiety: Secondary | ICD-10-CM | POA: Diagnosis not present

## 2022-11-30 LAB — LIPID PANEL
Cholesterol: 205 mg/dL — ABNORMAL HIGH (ref 0–200)
HDL: 67.1 mg/dL (ref >39.00)
LDL Cholesterol: 120 mg/dL — ABNORMAL HIGH (ref 0–99)
NonHDL: 137.93
Total CHOL/HDL Ratio: 3
Triglycerides: 91 mg/dL (ref 0.0–149.0)
VLDL: 18.2 mg/dL (ref 0.0–40.0)

## 2022-11-30 LAB — HEPATIC FUNCTION PANEL
ALT: 30 U/L (ref 0–53)
AST: 36 U/L (ref 0–37)
Albumin: 4.7 g/dL (ref 3.5–5.2)
Alkaline Phosphatase: 59 U/L (ref 39–117)
Bilirubin, Direct: 0.1 mg/dL (ref 0.0–0.3)
Total Bilirubin: 0.8 mg/dL (ref 0.2–1.2)
Total Protein: 7 g/dL (ref 6.0–8.3)

## 2022-11-30 LAB — CBC WITH DIFFERENTIAL/PLATELET
Basophils Absolute: 0 10*3/uL (ref 0.0–0.1)
Basophils Relative: 0.3 % (ref 0.0–3.0)
Eosinophils Absolute: 0.1 10*3/uL (ref 0.0–0.7)
Eosinophils Relative: 1.8 % (ref 0.0–5.0)
HCT: 48.4 % (ref 39.0–52.0)
Hemoglobin: 15.8 g/dL (ref 13.0–17.0)
Lymphocytes Relative: 30 % (ref 12.0–46.0)
Lymphs Abs: 1.2 10*3/uL (ref 0.7–4.0)
MCHC: 32.5 g/dL (ref 30.0–36.0)
MCV: 94 fL (ref 78.0–100.0)
Monocytes Absolute: 0.5 10*3/uL (ref 0.1–1.0)
Monocytes Relative: 11.8 % (ref 3.0–12.0)
Neutro Abs: 2.2 10*3/uL (ref 1.4–7.7)
Neutrophils Relative %: 56.1 % (ref 43.0–77.0)
Platelets: 236 10*3/uL (ref 150.0–400.0)
RBC: 5.15 Mil/uL (ref 4.22–5.81)
RDW: 13.4 % (ref 11.5–15.5)
WBC: 3.9 10*3/uL — ABNORMAL LOW (ref 4.0–10.5)

## 2022-11-30 LAB — BASIC METABOLIC PANEL
BUN: 14 mg/dL (ref 6–23)
CO2: 29 meq/L (ref 19–32)
Calcium: 9.7 mg/dL (ref 8.4–10.5)
Chloride: 99 meq/L (ref 96–112)
Creatinine, Ser: 0.84 mg/dL (ref 0.40–1.50)
GFR: 114.76 mL/min (ref >60.00)
Glucose, Bld: 88 mg/dL (ref 70–99)
Potassium: 4 meq/L (ref 3.5–5.1)
Sodium: 137 meq/L (ref 135–145)

## 2022-11-30 LAB — TSH: TSH: 3.5 u[IU]/mL (ref 0.35–5.50)

## 2022-11-30 LAB — HEMOGLOBIN A1C: Hgb A1c MFr Bld: 5.2 % (ref 4.6–6.5)

## 2022-12-02 LAB — LIPOPROTEIN A (LPA): Lipoprotein (a): 21 nmol/L (ref <75)

## 2023-02-07 NOTE — Progress Notes (Signed)
Blood results normal or stable except cholesterol up some from last year .  Continue lifestyle intervention healthy eating and exercise . Continue Yearly blood work. And CPE

## 2023-03-10 ENCOUNTER — Telehealth: Payer: Self-pay | Admitting: Internal Medicine

## 2023-03-10 NOTE — Telephone Encounter (Unsigned)
 Copied from CRM [id number]. Topic: General - Other >> Mar 10, 2023  9:50 AM Allyne Areola wrote: Reason for CRM: Patient's insurance broker informed patient that Dr.Panosh is not covered under the [employer] complete. He has been a long time patient of Dr.Panosh and does not want to change providers. Insurance informed that the office would need to call the insurance to provide some information to update on their end. Provider line for [employer] is [phone].

## 2023-03-12 NOTE — Telephone Encounter (Signed)
Attemped to reach pt. Voicemail has not been set up yet. Will try again.

## 2023-03-12 NOTE — Telephone Encounter (Signed)
Dial the provided contact number. Spoke to [employer] B with Occupational psychologist.  After confirming on patient and provider, Dr. Fabian Sharp, as PCP. She states that provider is in-network with patient. And it is covered under [employer].

## 2023-03-15 NOTE — Telephone Encounter (Signed)
Attempted to reach pt twice. Calls were dropped at both call.

## 2023-03-15 NOTE — Telephone Encounter (Signed)
When pt call back, please inform pt of the update that we received from his insurance's representative. See message below.

## 2023-05-21 ENCOUNTER — Other Ambulatory Visit: Payer: Self-pay | Admitting: Internal Medicine

## 2023-06-29 ENCOUNTER — Other Ambulatory Visit: Payer: Self-pay | Admitting: Internal Medicine

## 2023-09-27 DIAGNOSIS — Z5181 Encounter for therapeutic drug level monitoring: Secondary | ICD-10-CM | POA: Diagnosis not present

## 2023-09-27 DIAGNOSIS — Z808 Family history of malignant neoplasm of other organs or systems: Secondary | ICD-10-CM | POA: Diagnosis not present

## 2023-09-27 DIAGNOSIS — L649 Androgenic alopecia, unspecified: Secondary | ICD-10-CM | POA: Diagnosis not present

## 2023-11-25 ENCOUNTER — Ambulatory Visit: Admitting: Internal Medicine

## 2023-11-25 ENCOUNTER — Encounter: Payer: Self-pay | Admitting: Internal Medicine

## 2023-11-25 VITALS — BP 106/62 | Temp 97.8°F | Ht 69.0 in | Wt 183.0 lb

## 2023-11-25 DIAGNOSIS — Z0000 Encounter for general adult medical examination without abnormal findings: Secondary | ICD-10-CM | POA: Diagnosis not present

## 2023-11-25 DIAGNOSIS — E7800 Pure hypercholesterolemia, unspecified: Secondary | ICD-10-CM | POA: Diagnosis not present

## 2023-11-25 DIAGNOSIS — Z808 Family history of malignant neoplasm of other organs or systems: Secondary | ICD-10-CM | POA: Diagnosis not present

## 2023-11-25 DIAGNOSIS — F840 Autistic disorder: Secondary | ICD-10-CM

## 2023-11-25 DIAGNOSIS — F4322 Adjustment disorder with anxiety: Secondary | ICD-10-CM

## 2023-11-25 DIAGNOSIS — Z23 Encounter for immunization: Secondary | ICD-10-CM | POA: Diagnosis not present

## 2023-11-25 DIAGNOSIS — Z79899 Other long term (current) drug therapy: Secondary | ICD-10-CM | POA: Diagnosis not present

## 2023-11-25 LAB — COMPREHENSIVE METABOLIC PANEL WITH GFR
ALT: 14 U/L (ref 0–53)
AST: 24 U/L (ref 0–37)
Albumin: 4.8 g/dL (ref 3.5–5.2)
Alkaline Phosphatase: 44 U/L (ref 39–117)
BUN: 15 mg/dL (ref 6–23)
CO2: 31 meq/L (ref 19–32)
Calcium: 10.2 mg/dL (ref 8.4–10.5)
Chloride: 101 meq/L (ref 96–112)
Creatinine, Ser: 0.76 mg/dL (ref 0.40–1.50)
GFR: 117.47 mL/min (ref >60.00)
Glucose, Bld: 90 mg/dL (ref 70–99)
Potassium: 4.2 meq/L (ref 3.5–5.1)
Sodium: 139 meq/L (ref 135–145)
Total Bilirubin: 0.7 mg/dL (ref 0.2–1.2)
Total Protein: 7.1 g/dL (ref 6.0–8.3)

## 2023-11-25 LAB — CBC WITH DIFFERENTIAL/PLATELET
Basophils Absolute: 0 K/uL (ref 0.0–0.1)
Basophils Relative: 0.3 % (ref 0.0–3.0)
Eosinophils Absolute: 0 K/uL (ref 0.0–0.7)
Eosinophils Relative: 0.9 % (ref 0.0–5.0)
HCT: 43.5 % (ref 39.0–52.0)
Hemoglobin: 14.7 g/dL (ref 13.0–17.0)
Lymphocytes Relative: 30 % (ref 12.0–46.0)
Lymphs Abs: 1.3 K/uL (ref 0.7–4.0)
MCHC: 33.7 g/dL (ref 30.0–36.0)
MCV: 90.7 fl (ref 78.0–100.0)
Monocytes Absolute: 0.4 K/uL (ref 0.1–1.0)
Monocytes Relative: 10.2 % (ref 3.0–12.0)
Neutro Abs: 2.5 K/uL (ref 1.4–7.7)
Neutrophils Relative %: 58.6 % (ref 43.0–77.0)
Platelets: 222 K/uL (ref 150.0–400.0)
RBC: 4.8 Mil/uL (ref 4.22–5.81)
RDW: 13.7 % (ref 11.5–15.5)
WBC: 4.3 K/uL (ref 4.0–10.5)

## 2023-11-25 LAB — LIPID PANEL
Cholesterol: 182 mg/dL (ref 0–200)
HDL: 65.5 mg/dL (ref >39.00)
LDL Cholesterol: 103 mg/dL — ABNORMAL HIGH (ref 0–99)
NonHDL: 116.97
Total CHOL/HDL Ratio: 3
Triglycerides: 70 mg/dL (ref 0.0–149.0)
VLDL: 14 mg/dL (ref 0.0–40.0)

## 2023-11-25 LAB — T4, FREE: Free T4: 0.8 ng/dL (ref 0.60–1.60)

## 2023-11-25 LAB — TSH: TSH: 1.8 u[IU]/mL (ref 0.35–5.50)

## 2023-11-25 NOTE — Patient Instructions (Signed)
 Good to see you today  Exam is normal   Checking lab  Flu vaccine. Will  look into consideration of folate meds to see if could help autism sx

## 2023-11-25 NOTE — Progress Notes (Signed)
 Chief Complaint  Patient presents with   Annual Exam    HPI: Patient  Brian Mcgrath  33 y.o. comes in today for Preventive Health Care visit   seen alone but comes at same time as mom .  Since last visit no change in health  Continiues to work [employer]  ref and work out every day  No health concerns otherwise reported   On same meds  even mood help.  Mom aska bout  reports of help with lleucovorin as poss   Health Maintenance  Topic Date Due   Hepatitis B Vaccines 19-59 Average Risk (1 of 3 - 19+ 3-dose series) Never done   HPV VACCINES (1 - 3-dose SCDM series) Never done   Medicare Annual Wellness (AWV)  07/28/2023   COVID-19 Vaccine (4 - 2025-26 season) 12/11/2023 (Originally 10/25/2023)   DTaP/Tdap/Td (3 - Tdap) 10/15/2030   Influenza Vaccine  Completed   Hepatitis C Screening  Completed   HIV Screening  Completed   Pneumococcal Vaccine  Aged Out   Meningococcal B Vaccine  Aged Out   Health Maintenance Review LIFESTYLE:  Exercise:   work ou t 7 days per week.  Tobacco/ETS:  n Alcohol: n Sugar beverages:n Sleep: y Drug use: no HH of 3 Work: Special educational needs teacher     ROS:  GEN/ HEENT: No fever, significant weight changes sweats headaches vision problems hearing changes, CV/ PULM; No chest pain shortness of breath cough, syncope,edema  change in exercise tolerance. GI /GU: No adominal pain, vomiting, change in bowel habits. No blood in the stool. No significant GU symptoms. SKIN/HEME: ,no acute skin rashes suspicious lesions or bleeding. No lymphadenopathy, nodules, masses.  NEURO/ PSYCH:  No weakness numbness.  IMM/ Allergy: No unusual infections.  Allergy .   REST of 12 system review negative except as per HPI   Past Medical History:  Diagnosis Date   Acne    Allergic rhinitis    Autism    FRACTURE, ARM, RIGHT 07/18/2008   Qualifier: History of  By: Charlett MD, Apolinar POUR    Varicella     Past Surgical History:  Procedure Laterality Date   ADENOIDECTOMY      FRACTURE SURGERY     right arm    recircumcised     rock removal from ear  1999   TONSILLECTOMY  2003    Family History  Problem Relation Age of Onset   Diverticulosis Mother    Depression Father     Social History   Socioeconomic History   Marital status: Single    Spouse name: Not on file   Number of children: Not on file   Years of education: Not on file   Highest education level: Not on file  Occupational History   Not on file  Tobacco Use   Smoking status: Never   Smokeless tobacco: Never  Vaping Use   Vaping status: Never Used  Substance and Sexual Activity   Alcohol use: No   Drug use: Never   Sexual activity: Not on file  Other Topics Concern   Not on file  Social History Narrative   Lives at home with parents is working 3 days a week  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[employer]       Older brother      Social Drivers of Corporate investment banker Strain: Low Risk  (08/04/2021)   Overall Financial Resource Strain (CARDIA)    Difficulty of  Paying Living Expenses: Not hard at all  Food Insecurity: No Food Insecurity (08/04/2021)   Hunger Vital Sign    Worried About Running Out of Food in the Last Year: Never true    Ran Out of Food in the Last Year: Never true  Transportation Needs: No Transportation Needs (08/04/2021)   PRAPARE - Administrator, Civil Service (Medical): No    Lack of Transportation (Non-Medical): No  Physical Activity: Sufficiently Active (11/18/2022)   Exercise Vital Sign    Days of Exercise per Week: 7 days    Minutes of Exercise per Session: 30 min  Stress: No Stress Concern Present (08/04/2021)   [employer] of Occupational Health - Occupational Stress Questionnaire    Feeling of Stress : Not at all  Social Connections: Moderately Integrated (07/25/2020)   Social Connection and Isolation Panel    Frequency of Communication with Friends and Family: Three times a week    Frequency of Social Gatherings  with Friends and Family: Three times a week    Attends Religious Services: More than 4 times per year    Active Member of Clubs or Organizations: Yes    Attends Banker Meetings: More than 4 times per year    Marital Status: Never married    Outpatient Medications Prior to Visit  Medication Sig Dispense Refill   adapalene  (DIFFERIN ) 0.1 % cream APPLY TOPICALLY AT BEDTIME. PEA SIZED AMOUNT TO AFFTECTED AREA AS DIRECTED. 45 g 3   BIOTIN PO Take by mouth. Takes 2 gummies per day     MULTIPLE VITAMIN PO Take 1 tablet by mouth daily.     Omega-3 Fatty Acids (FISH OIL PO) Take by mouth daily.     omeprazole  (PRILOSEC) 20 MG capsule Take 1 capsule (20 mg total) by mouth daily. Or as directed 30 capsule 2   valACYclovir  (VALTREX ) 1000 MG tablet TAKE 1 TABLET(1000 MG) BY MOUTH DAILY 90 tablet 2   escitalopram  (LEXAPRO ) 20 MG tablet TAKE 2 TABLETS(40 MG) BY MOUTH DAILY 180 tablet 2   finasteride (PROSCAR) 5 MG tablet Take 2.5 mg by mouth daily.     guanFACINE  (TENEX ) 1 MG tablet TAKE 3/4 TABLET BY MOUTH TWICE DAILY 135 tablet 3   minoxidil (LONITEN) 2.5 MG tablet Take 1.25 mg by mouth daily.     No facility-administered medications prior to visit.     EXAM:  BP 106/62 (BP Location: Left Arm, Patient Position: Sitting, Cuff Size: Normal)   Temp 97.8 F (36.6 C) (Oral)   Ht 5' 9 (1.753 m)   Wt 183 lb (83 kg)   SpO2 97%   BMI 27.02 kg/m   Body mass index is 27.02 kg/m. Wt Readings from Last 3 Encounters:  11/25/23 183 lb (83 kg)  11/18/22 181 lb 12.8 oz (82.5 kg)  11/05/21 175 lb 3.2 oz (79.5 kg)    Physical Exam: Vital signs reviewed HZW:Uypd is a well-developed well-nourished alert cooperative    who appearsr stated age in no acute distress.  Autistic  speech but interactive and cooperativ  HEENT: normocephalic atraumatic , Eyes: PERRL EOM's full, conjunctiva clear, Nares: paten,t no deformity discharge or tenderness., Ears: no deformity EAC's wa but TMs with normal  landmarks. Mouth: clear OP, no lesions, edema.  Moist mucous membranes. Dentition in adequate repair. NECK: supple without masses, thyromegaly or bruits. CHEST/PULM:  Clear to auscultation and percussion breath sounds equal no wheeze , rales or rhonchi. No chest wall deformities or tenderness. CV: PMI  is nondisplaced, S1 S2 no gallops, murmurs, rubs. Peripheral pulses are full without delay.No JVD .  ABDOMEN: Bowel sounds normal nontender  No guard or rebound, no hepato splenomegal no CVA tenderness.   Extremtities:  No clubbing cyanosis or edema, no acute joint swelling or redness no focal atrophy NEURO:  Oriented x3, cranial nerves 3-12 appear to be intact, no obvious focal weakness,gait within normal limitsnl gait  no excess motor activity today  SKIN: No acute rashes normal turgor, color, no bruising or petechiae. PSYCH: Oriented, dec eye contact monitone but  interactive speech  LN: no cervical axillary  adenopathy  Lab Results  Component Value Date   WBC 3.9 (L) 11/30/2022   HGB 15.8 11/30/2022   HCT 48.4 11/30/2022   PLT 236.0 11/30/2022   GLUCOSE 88 11/30/2022   CHOL 205 (H) 11/30/2022   TRIG 91.0 11/30/2022   HDL 67.10 11/30/2022   LDLDIRECT 102.0 05/23/2014   LDLCALC 120 (H) 11/30/2022   ALT 30 11/30/2022   AST 36 11/30/2022   NA 137 11/30/2022   K 4.0 11/30/2022   CL 99 11/30/2022   CREATININE 0.84 11/30/2022   BUN 14 11/30/2022   CO2 29 11/30/2022   TSH 3.50 11/30/2022   HGBA1C 5.2 11/30/2022    BP Readings from Last 3 Encounters:  11/25/23 106/62  11/18/22 126/80  11/05/21 110/78    Lab rplan update  ASSESSMENT AND PLAN:  Discussed the following assessment and plan:    ICD-10-CM   1. Visit for preventive health examination  Z00.00 CBC with Differential/Platelet    Comprehensive metabolic panel with GFR    Lipid panel    TSH    T4, Free    2. Influenza vaccine needed  Z23 Flu vaccine trivalent PF, 6mos and older(Flulaval,Afluria,Fluarix,Fluzone)     3. Medication management  Z79.899 CBC with Differential/Platelet    Comprehensive metabolic panel with GFR    Lipid panel    TSH    T4, Free    4. Autistic disorder  F84.0 CBC with Differential/Platelet    Comprehensive metabolic panel with GFR    Lipid panel    TSH    T4, Free    5. Adjustment disorder with anxiety  F43.22 CBC with Differential/Platelet    Comprehensive metabolic panel with GFR    Lipid panel    TSH    T4, Free    6. Elevated LDL cholesterol level  E78.00 CBC with Differential/Platelet    Comprehensive metabolic panel with GFR    Lipid panel    TSH    T4, Free    7. Family history of thyroid  cancer  Z80.8 CBC with Differential/Platelet    Comprehensive metabolic panel with GFR    Lipid panel    TSH    T4, Free    Will keep watch on  issues of folic acid metabolism if could help  even at his age  afterand if fda  gets label change for leucovorin  . ? Empiric use?  Return in about 1 year (around 11/24/2024) for depending on results.  Patient Care Team: Niaya Hickok K, MD as PCP - General Patient Instructions  Good to see you today  Exam is normal   Checking lab  Flu vaccine. Will  look into consideration of folate meds to see if could help autism sx   Apolinar POUR. Keryn Nessler M.D.

## 2023-11-28 ENCOUNTER — Ambulatory Visit: Payer: Self-pay | Admitting: Internal Medicine

## 2023-11-28 NOTE — Progress Notes (Signed)
 Lab results normal  range  cholesterol in favorable range improved from last year

## 2024-02-16 ENCOUNTER — Other Ambulatory Visit: Payer: Self-pay | Admitting: Family

## 2024-02-21 ENCOUNTER — Telehealth: Payer: Self-pay

## 2024-02-21 NOTE — Telephone Encounter (Signed)
 Copied from CRM [id number]. Topic: Clinical - Prescription Issue >> Feb 21, 2024 10:46 AM Alfonso ORN wrote: Reason for CRM: pt mom stated that pharmacy told her this rx was denied by pcp: valACYclovir  (VALTREX ) 1000 MG tablet  . Not showing as denied please advise Pt mom Teddi [phone]

## 2024-02-21 NOTE — Telephone Encounter (Signed)
 Rx was sent on 02/16/2024. Attempted to call pharmacy. They are close for lunch.

## 2024-02-22 ENCOUNTER — Other Ambulatory Visit: Payer: Self-pay | Admitting: Internal Medicine

## 2024-02-22 NOTE — Telephone Encounter (Unsigned)
 Copied from CRM [id number]. Topic: Clinical - Prescription Issue >> Feb 21, 2024 10:46 AM Alfonso ORN wrote: Reason for CRM: pt mom stated that pharmacy told her this rx was denied by pcp: valACYclovir  (VALTREX ) 1000 MG tablet  . Not showing as denied please advise Pt mom Teddi [phone] >> Feb 22, 2024  9:28 AM Emylou G wrote: Pls call mom back.. she still waiting on script for  valACYclovir  HCl 1000 MG?  See below.. pharmacy is waiting on us ?

## 2024-02-22 NOTE — Telephone Encounter (Signed)
 Contacted pharmacy and spoke to Bayard. She states they did not received the Rx from 02/16/2024. Ask for Rx to be resend. Rx is send. Attempted to contact pt. Left a voicemail to call us  back.

## 2024-03-24 ENCOUNTER — Other Ambulatory Visit: Payer: Self-pay | Admitting: Internal Medicine

## 2024-11-30 ENCOUNTER — Encounter: Admitting: Internal Medicine
